# Patient Record
Sex: Male | Born: 1955 | Race: Black or African American | Hispanic: No | Marital: Married | Smoking: Former smoker
Health system: Southern US, Community
[De-identification: ages and names within clinical notes are randomized; demographics above are authoritative.]

## PROBLEM LIST (undated history)

## (undated) DIAGNOSIS — I517 Cardiomegaly: Secondary | ICD-10-CM

## (undated) DIAGNOSIS — J9611 Chronic respiratory failure with hypoxia: Secondary | ICD-10-CM

## (undated) DIAGNOSIS — R0902 Hypoxemia: Secondary | ICD-10-CM

## (undated) DIAGNOSIS — Z6841 Body Mass Index (BMI) 40.0 and over, adult: Secondary | ICD-10-CM

## (undated) DIAGNOSIS — E119 Type 2 diabetes mellitus without complications: Secondary | ICD-10-CM

## (undated) HISTORY — PX: KNEE SURGERY: SHX244

## (undated) HISTORY — DX: Type 2 diabetes mellitus without complications: E11.9

## (undated) HISTORY — DX: Body Mass Index (BMI) 40.0 and over, adult: Z684

## (undated) HISTORY — DX: Hypoxemia: R09.02

## (undated) HISTORY — DX: Chronic respiratory failure with hypoxia: J96.11

## (undated) HISTORY — DX: Cardiomegaly: I51.7

---

## 2010-02-07 HISTORY — PX: OTHER SURGICAL HISTORY: SHX169

## 2016-10-08 HISTORY — PX: TOTAL KNEE ARTHROPLASTY: SHX125

## 2018-04-06 NOTE — Progress Notes (Signed)
Cardiology Office Note   Date:  04/11/2018   ID:  [place]qu[place], Wider May 16, 1955, MRN [id number]  PCP:  Joaquin Courts, DO  Cardiologist:   Charlton Haws, MD   No chief complaint on file.     History of Present Illness: Levi Nelson is a 63 y.o. male who presents for consultation regarding cardiomegaly Referred by primary MD Ernst Breach DO and Legrand Pitts PA in [place] He also sees pulmonary there Dr Loleta Chance He has significant COPD He is concerned that he has an enlarged heart which is causing chronic hypoxemic respiratory failure Sats normally around 90% Uses oxygen 2L's Quit smoking in 2010 He has a 8 mm nodule in LLL being followed by CT most recent 12/01/17   He likely has OSA  But did not want to spend money on sleep study  He indicates being admitted to hospital in October for 24 hours being diuresed and sent home on oxygen which he has been on ever since.   I have no documentation of PFTls, R/O shunt CTA or V/Q to r/o PE. He has no idea What his diagnosis is that is causing hypoxic respiratory failure   No chest pain LE edema. No cough or sputum or fever    Past Medical History:  Diagnosis Date  . Body mass index (BMI) 40.0-44.9, adult (HCC)   . Cardiomegaly   . Chronic hypoxemic respiratory failure (HCC)   . Chronic respiratory failure with hypoxia (HCC)   . DM (diabetes mellitus) (HCC)   . Hypoxemia     Past Surgical History:  Procedure Laterality Date  . COLONSCOPY  02/07/2010  . KNEE SURGERY Left   . 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 Dulaglutide (TRULICITY) 0.75 MG/0.5ML SOPN Inject into the skin.    . furosemide (LASIX) 20 MG tablet Take 20 mg by mouth.    . metFORMIN (GLUCOPHAGE) 500 MG tablet Take 500 mg by mouth daily with breakfast.    . NONFORMULARY OR COMPOUNDED ITEM CONTOUR NEXT METER USE TWO TIMES A DAY    . OXYGEN Inhale into the lungs. 2 LITERS      No current facility-administered medications for this visit.     Allergies:   Patient has no known allergies.    Social History:  The patient  reports that he has quit smoking. His smoking use included cigarettes. He quit after 25.00 years of use. He has never used smokeless tobacco. He reports that he does not drink alcohol or use drugs.   Family History:  The patient's family history includes Arthritis in his father, mother, and sister.    ROS:  Please see the history of present illness.   Otherwise, review of systems are positive for none.   All other systems are reviewed and negative.    PHYSICAL EXAM: VS:  BP 138/78   Pulse 83   Ht 5\' 11"  (1.803 m)   Wt 135.8 kg   SpO2 91%   BMI 41.76 kg/m  , BMI Body mass index is 41.76 kg/m. Affect appropriate Obese  HEENT: normal Neck supple with no adenopathy JVP normal no bruits no thyromegaly Lungs clear with no wheezing and good diaphragmatic motion Heart:  S1/S2 no murmur, no rub, gallop or click PMI normal Abdomen: benighn, BS positve, no tenderness, no AAA no bruit.  No HSM or HJR Distal pulses intact with no bruits No edema Neuro non-focal Skin  warm and dry Right TKR     EKG:  SR RVH   Recent Labs: No results found for requested labs within last 8760 hours.    Lipid Panel No results found for: CHOL, TRIG, HDL, CHOLHDL, VLDL, LDLCALC, LDLDIRECT    Wt Readings from Last 3 Encounters:  04/11/18 135.8 kg      Other studies Reviewed: Additional studies/ records that were reviewed today include: Notes from primary and pulmonary  In [place] CT chest .    ASSESSMENT AND PLAN:  1.  Cardiomegaly likely more cor - pulmonale see below  2.  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 Will check echo for RV/LV size and function if signs of  pulmonary hypertension will refer to DM/DB for right and left cath and then to [facility] Pulmonary will order sleep study and f/u with Dr Mayford Knife.  3. DM:  Discussed low carb diet.  Target hemoglobin A1c is 6.5 or less.  Continue current medications.  4. Edema:  Will need to r/o DVT/PE at some point I think his CT in [place] was non contrast   Current medicines are reviewed at length with the patient today.  The patient does not have concerns regarding medicines.  The following changes have been made:  no change  Labs/ tests ordered today include: TTE   Orders Placed This Encounter  Procedures  . Ambulatory referral to Pulmonology  . EKG 12-Lead  . ECHOCARDIOGRAM COMPLETE  . Split night study     Disposition:   FU with cardiology pending results of TTE F/u with Dr Mayford Knife after sleep study F/U with [facility] arranged     Signed, Charlton Haws, MD  04/11/2018 5:05 PM    [facility] [address] [place], [place], [place]  [address] Phone: [phone]; Fax: [phone]

## 2018-04-10 ENCOUNTER — Encounter: Payer: Self-pay | Admitting: Cardiovascular Disease

## 2018-04-11 ENCOUNTER — Ambulatory Visit: Payer: 59 | Admitting: Cardiovascular Disease

## 2018-04-11 ENCOUNTER — Encounter

## 2018-04-11 VITALS — BP 138/78 | HR 83 | Ht 71.0 in | Wt 299.4 lb

## 2018-04-11 DIAGNOSIS — Z9981 Dependence on supplemental oxygen: Secondary | ICD-10-CM | POA: Diagnosis not present

## 2018-04-11 DIAGNOSIS — I2781 Cor pulmonale (chronic): Secondary | ICD-10-CM

## 2018-04-11 DIAGNOSIS — I517 Cardiomegaly: Secondary | ICD-10-CM | POA: Diagnosis not present

## 2018-04-11 DIAGNOSIS — J449 Chronic obstructive pulmonary disease, unspecified: Secondary | ICD-10-CM

## 2018-04-11 DIAGNOSIS — I272 Pulmonary hypertension, unspecified: Secondary | ICD-10-CM

## 2018-04-11 NOTE — Patient Instructions (Addendum)
Medication Instructions:  If you need a refill on your cardiac medications before your next appointment, please call your pharmacy.   Lab work: If you have labs (blood work) drawn today and your tests are completely normal, you will receive your results only by: Marland Kitchen MyChart Message (if you have MyChart) OR . A paper copy in the mail If you have any lab test that is abnormal or we need to change your treatment, we will call you to review the results.  Testing/Procedures: Your physician has requested that you have an echocardiogram as soon as possible. Echocardiography is a painless test that uses sound waves to create images of your heart. It provides your doctor with information about the size and shape of your heart and how well your heart's chambers and valves are working. This procedure takes approximately one hour. There are no restrictions for this procedure.  Follow-Up: At [facility], you and your health needs are our priority.  As part of our continuing mission to provide you with exceptional heart care, we have created designated Provider Care Teams.  These Care Teams include your primary Cardiologist (physician) and Advanced Practice Providers (APPs -  Physician Assistants and Nurse Practitioners) who all work together to provide you with the care you need, when you need it. You will need a follow up appointment in 6 weeks.  You may see Dr. Eden Emms or one of the following Advanced Practice Providers on your designated Care Team:   Norma Fredrickson, NP Nada Boozer, NP . Georgie Chard, NP  You have been referred to Pulmonology for oxygen dependent/COPD.  You have been referred to Dr. Mayford Knife for Sleep apnea.

## 2018-04-12 ENCOUNTER — Telehealth: Payer: Self-pay | Admitting: *Deleted

## 2018-04-12 NOTE — Telephone Encounter (Signed)
-----   Message from Ethelda Chick, RN sent at 04/12/2018  4:59 PM EST ----- [name],  Patient has been ordered a sleep study and f/u with Dr. Mayford Knife. Nothing has been scheduled yet.   Thanks, Pam

## 2018-04-13 ENCOUNTER — Telehealth: Payer: Self-pay | Admitting: *Deleted

## 2018-04-13 NOTE — Telephone Encounter (Signed)
Sleep study PA request submitted via web portal to [facility]. Clinical note faxed to [phone].

## 2018-04-13 NOTE — Telephone Encounter (Signed)
-----   Message from Reesa Chew, CMA sent at 04/12/2018  6:13 PM EST ----- Regarding: precert  ----- Message ----- From: Ethelda Chick, RN Sent: 04/12/2018   4:59 PM EST To: Reesa Chew, CMA  [place],  Patient has been ordered a sleep study and f/u with Dr. Mayford Knife. Nothing has been scheduled yet.   Thanks, Pam

## 2018-04-18 ENCOUNTER — Telehealth: Payer: Self-pay | Admitting: *Deleted

## 2018-04-18 NOTE — Telephone Encounter (Signed)
Staff message sent to [facility] approval received. Ok to schedule sleep study. Auth # [id number]. Valid dates 04/13/18 to 11/07/18.

## 2018-04-19 NOTE — Telephone Encounter (Signed)
RE: precert  Gaynelle Cage, CMA  Reesa Chew, CMA        [facility] auth received. Ok to schedule sleep study. Auth # [id number]. Valid dates 04/13/18 to 11/07/18.

## 2018-04-19 NOTE — Telephone Encounter (Signed)
Patient is scheduled for lab study on 06/10/18. Patient understands his sleep study will be done at [facility] sleep lab. Patient understands he will receive a sleep packet in a week or so. Patient understands to call if he does not receive the sleep packet in a timely manner. Patient agrees with treatment and thanked me for call.

## 2018-04-20 ENCOUNTER — Ambulatory Visit (HOSPITAL_COMMUNITY): Payer: 59 | Attending: Cardiology

## 2018-04-20 ENCOUNTER — Other Ambulatory Visit: Payer: Self-pay

## 2018-04-20 DIAGNOSIS — I272 Pulmonary hypertension, unspecified: Secondary | ICD-10-CM | POA: Insufficient documentation

## 2018-04-20 DIAGNOSIS — J449 Chronic obstructive pulmonary disease, unspecified: Secondary | ICD-10-CM | POA: Diagnosis not present

## 2018-04-20 DIAGNOSIS — Z9981 Dependence on supplemental oxygen: Secondary | ICD-10-CM | POA: Insufficient documentation

## 2018-04-20 DIAGNOSIS — I2781 Cor pulmonale (chronic): Secondary | ICD-10-CM

## 2018-04-24 ENCOUNTER — Other Ambulatory Visit: Payer: Self-pay

## 2018-04-24 ENCOUNTER — Ambulatory Visit: Payer: 59

## 2018-04-24 ENCOUNTER — Ambulatory Visit (INDEPENDENT_AMBULATORY_CARE_PROVIDER_SITE_OTHER): Payer: 59 | Admitting: Emergency Medicine

## 2018-04-24 ENCOUNTER — Encounter: Payer: Self-pay | Admitting: Emergency Medicine

## 2018-04-24 DIAGNOSIS — J9611 Chronic respiratory failure with hypoxia: Secondary | ICD-10-CM | POA: Diagnosis not present

## 2018-04-24 DIAGNOSIS — I2729 Other secondary pulmonary hypertension: Secondary | ICD-10-CM | POA: Diagnosis not present

## 2018-04-24 DIAGNOSIS — R0683 Snoring: Secondary | ICD-10-CM | POA: Insufficient documentation

## 2018-04-24 DIAGNOSIS — I270 Primary pulmonary hypertension: Secondary | ICD-10-CM | POA: Diagnosis not present

## 2018-04-24 NOTE — Assessment & Plan Note (Signed)
Suspect that this is been driven by longstanding obstructive sleep apnea.  Need to rule out other contributors including systemic hypertension (diastolic 88 today), possible thromboembolic disease, possible obstructive lung disease, possible autoimmune process.  We will start screening for all of the above with PFT, VQ scan, autoimmune labs, sleep study.

## 2018-04-24 NOTE — Assessment & Plan Note (Signed)
Sleep study currently scheduled for May, will see if we can move it up.

## 2018-04-24 NOTE — Patient Instructions (Addendum)
We will perform lab work today. We will arrange for full pulmonary function testing at your next office visit. Agree with getting your split-night sleep study.  We will see if we can get this done sooner than May 2020 We will perform a ventilation perfusion scan Please continue to wear your oxygen at 2 L/min as you have been doing. Continue your Lasix as ordered Follow with Dr Delton Coombes next available after your sleep study to discuss this and your other testing.

## 2018-04-24 NOTE — Progress Notes (Signed)
Subjective:    Patient ID: Levi Nelson, male    DOB: 17-Jun-1955, 63 y.o.   MRN: [id number]  HPI 63 year old obese man, former smoker (6-7 pack years) with diabetes, knee replacement,  possible COPD who was found to be hypoxemic on hospital admission to [facility] 11/2017.  It sounds like he probably had evidence for cor pulmonale and volume overload, was diuresed.  He was discharged on 2 L/min. He was tried empirically on Anoro after the hospitalization - no real change. He is on a stable dose lasix since the hospitalization. Never on diet meds.   He can get dyspnea with exertion, chores. No real cough.   He has never had a sleep study, is scheduled for a split-night study in May 2020  Review of his cardiology notes indicates that he had CT scan of his chest done in October 2019, had an 8 mm left lower lobe nodule.  I do not have those films available currently.  TTE 04/20/2018 >> intact LV fxn, evidence for RV strain and PAH. PASP wasn't estimated. Valves OK.    Review of Systems  Constitutional: Negative for fever and unexpected weight change.  HENT: Negative for congestion, dental problem, ear pain, nosebleeds, postnasal drip, rhinorrhea, sinus pressure, sneezing, sore throat and trouble swallowing.   Eyes: Negative for redness and itching.  Respiratory: Positive for shortness of breath. Negative for cough, chest tightness and wheezing.   Cardiovascular: Negative for palpitations and leg swelling.  Gastrointestinal: Negative for nausea and vomiting.  Genitourinary: Negative for dysuria.  Musculoskeletal: Negative for joint swelling.  Skin: Negative for rash.  Neurological: Negative for headaches.  Hematological: Does not bruise/bleed easily.  Psychiatric/Behavioral: Negative for dysphoric mood. The patient is not nervous/anxious.     Past Medical History:  Diagnosis Date  . Body mass index (BMI) 40.0-44.9, adult (HCC)   . Cardiomegaly   . Chronic hypoxemic respiratory  failure (HCC)   . Chronic respiratory failure with hypoxia (HCC)   . DM (diabetes mellitus) (HCC)   . Hypoxemia      Family History  Problem Relation Age of Onset  . Arthritis Mother   . Arthritis Father   . Arthritis Sister   No hx PAH or pulm disease.   Social History   Socioeconomic History  . Marital status: Married    Spouse name: Not on file  . Number of children: Not on file  . Years of education: Not on file  . Highest education level: Not on file  Occupational History  . Not on file  Social Needs  . Financial resource strain: Not on file  . Food insecurity:    Worry: Not on file    Inability: Not on file  . Transportation needs:    Medical: Not on file    Non-medical: Not on file  Tobacco Use  . Smoking status: Former Smoker    Years: 25.00    Types: Cigarettes  . Smokeless tobacco: Never Used  Substance and Sexual Activity  . Alcohol use: Never    Frequency: Never  . Drug use: Never  . Sexual activity: Not on file  Lifestyle  . Physical activity:    Days per week: Not on file    Minutes per session: Not on file  . Stress: Not on file  Relationships  . Social connections:    Talks on phone: Not on file    Gets together: Not on file    Attends religious service: Not on file  Active member of club or organization: Not on file    Attends meetings of clubs or organizations: Not on file    Relationship status: Not on file  . Intimate partner violence:    Fear of current or ex partner: Not on file    Emotionally abused: Not on file    Physically abused: Not on file    Forced sexual activity: Not on file  Other Topics Concern  . Not on file  Social History Narrative  . 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   [place] native. Lives in [place].   No Known Allergies   Outpatient Medications Prior to Visit  Medication Sig Dispense Refill  .  Dulaglutide (TRULICITY) 0.75 MG/0.5ML SOPN Inject into the skin.    . furosemide (LASIX) 20 MG tablet Take 20 mg by mouth daily.    . NONFORMULARY OR COMPOUNDED ITEM CONTOUR NEXT METER USE TWO TIMES A DAY    . OXYGEN Inhale into the lungs. 2 LITERS    . furosemide (LASIX) 20 MG tablet Take 20 mg by mouth.    . metFORMIN (GLUCOPHAGE) 500 MG tablet Take 500 mg by mouth daily with breakfast.     No facility-administered medications prior to visit.         Objective:   Physical Exam  Vitals:   04/24/18 1605  BP: 120/88  Pulse: 85  SpO2: 94%   Gen: Pleasant, overweight gentleman, in no distress,  normal affect  ENT: No lesions,  mouth clear,  oropharynx clear, no postnasal drip  Neck: No JVD, no stridor  Lungs: No use of accessory muscles, no crackles or wheezing on normal respiration, no wheeze on forced expiration  Cardiovascular: RRR, heart sounds normal, no murmur or gallops, only trace lower extremity peripheral edema  Musculoskeletal: No deformities, no cyanosis or clubbing  Neuro: alert, awake, non focal  Skin: Warm, no lesions or rash       Assessment & Plan:  Chronic hypoxemic respiratory failure (HCC) Suspect that this is related to right heart dysfunction and longstanding pulmonary hypertension.  His hypoxemia was discovered occultly, has probably been insidious.  Continue his supplemental oxygen at all times for now, may ultimately be able to come off if we can get his right heart function and pulmonary pressures better compensated.  Other secondary pulmonary hypertension (HCC) Suspect that this is been driven by longstanding obstructive sleep apnea.  Need to rule out other contributors including systemic hypertension (diastolic 88 today), possible thromboembolic disease, possible obstructive lung disease, possible autoimmune process.  We will start screening for all of the above with PFT, VQ scan, autoimmune labs, sleep study.  Snoring Sleep study currently  scheduled for May, will see if we can move it up.   Levy Pupa, MD, PhD 04/24/2018, 4:59 PM [facility] [phone] or if no answer [phone]

## 2018-04-24 NOTE — Assessment & Plan Note (Signed)
Suspect that this is related to right heart dysfunction and longstanding pulmonary hypertension.  His hypoxemia was discovered occultly, has probably been insidious.  Continue his supplemental oxygen at all times for now, may ultimately be able to come off if we can get his right heart function and pulmonary pressures better compensated.

## 2018-04-25 ENCOUNTER — Telehealth: Payer: Self-pay | Admitting: Emergency Medicine

## 2018-04-25 NOTE — Telephone Encounter (Signed)
Called the patient back and advised him that lab results have not been received yet. And that he will be called once they have been received and reviewed by Dr. Delton Coombes.  Patient asked about the other tests that were ordered. I told him the PFT would be done in our office. However the sleep test at [facility] normally would be scheduled by our PCC's  I checked with Almyra Free [facility]) and she confirmed it may not be scheduled for a couple of months due to coronavirus outbreak.  Advised patient of this, he voiced understanding. Nothing further needed at this time.

## 2018-04-26 LAB — ANA: ANA: POSITIVE — AB

## 2018-04-26 LAB — ANTI-NUCLEAR AB-TITER (ANA TITER): ANA Titer 1: 1:40 {titer} — ABNORMAL HIGH

## 2018-04-26 LAB — ANGIOTENSIN CONVERTING ENZYME: Angiotensin-Converting Enzyme: 25 U/L (ref 9–67)

## 2018-04-26 LAB — ANTI-SCLERODERMA ANTIBODY: Scleroderma (Scl-70) (ENA) Antibody, IgG: <1 AI

## 2018-04-26 LAB — RNP ANTIBODY: Ribonucleic Protein(ENA) Antibody, IgG: <1 AI

## 2018-04-26 LAB — RHEUMATOID FACTOR: Rheumatoid fact SerPl-aCnc: 33 IU/mL — ABNORMAL HIGH (ref <14)

## 2018-05-10 ENCOUNTER — Ambulatory Visit: Payer: Self-pay | Admitting: Interventional Cardiology

## 2018-05-18 ENCOUNTER — Telehealth: Payer: Self-pay

## 2018-05-18 NOTE — Telephone Encounter (Signed)
Virtual Visit Pre-Appointment Phone Call  Steps For Call:  1. Confirm consent - "In the setting of the current Covid19 crisis, you are scheduled for a (phone or video) visit with your provider on (date) at (time).  Just as we do with many in-office visits, in order for you to participate in this visit, we must obtain consent.  If you'd like, I can send this to your mychart (if signed up) or email for you to review.  Otherwise, I can obtain your verbal consent now.  All virtual visits are billed to your insurance company just like a normal visit would be.  By agreeing to a virtual visit, we'd like you to understand that the technology does not allow for your provider to perform an examination, and thus may limit your provider's ability to fully assess your condition.  Finally, though the technology is pretty good, we cannot assure that it will always work on either your or our end, and in the setting of a video visit, we may have to convert it to a phone-only visit.  In either situation, we cannot ensure that we have a secure connection.  Are you willing to proceed?"  2. Give patient instructions for WebEx download to smartphone as below if video visit  3. Advise patient to be prepared with any vital sign or heart rhythm information, their current medicines, and a piece of paper and pen handy for any instructions they may receive the day of their visit  4. Inform patient they will receive a phone call 15 minutes prior to their appointment time (may be from unknown caller ID) so they should be prepared to answer  5. Confirm that appointment type is correct in Epic appointment notes (video vs telephone)    TELEPHONE CALL NOTE  Levi Nelson has been deemed a candidate for a follow-up tele-health visit to limit community exposure during the Covid-19 pandemic. I spoke with the patient via phone to ensure availability of phone/video source, confirm preferred email & phone number, and discuss  instructions and expectations.  I reminded Levi Nelson to be prepared with any vital sign and/or heart rhythm information that could potentially be obtained via home monitoring, at the time of his visit. I reminded Levi Nelson to expect a phone call at the time of his visit if his visit.  Did the patient verbally acknowledge consent to treatment? Yes  Levi Copas[place]asha  Correll Nelson, CMA 05/18/2018 3:10 PM    CONSENT FOR TELE-HEALTH VISIT - PLEASE REVIEW  I hereby voluntarily request, consent and authorize [facility] and its employed or contracted physicians, physician assistants, nurse practitioners or other licensed health care professionals (the Practitioner), to provide me with telemedicine health care services (the Services") as deemed necessary by the treating Practitioner. I acknowledge and consent to receive the Services by the Practitioner via telemedicine. I understand that the telemedicine visit will involve communicating with the Practitioner through live audiovisual communication technology and the disclosure of certain medical information by electronic transmission. I acknowledge that I have been given the opportunity to request an in-person assessment or other available alternative prior to the telemedicine visit and am voluntarily participating in the telemedicine visit.  I understand that I have the right to withhold or withdraw my consent to the use of telemedicine in the course of my care at any time, without affecting my right to future care or treatment, and that the Practitioner or I may terminate the telemedicine visit at any time. I  understand that I have the right to inspect all information obtained and/or recorded in the course of the telemedicine visit and may receive copies of available information for a reasonable fee.  I understand that some of the potential risks of receiving the Services via telemedicine include:   Delay or interruption in medical evaluation due to technological  equipment failure or disruption;  Information transmitted may not be sufficient (e.g. poor resolution of images) to allow for appropriate medical decision making by the Practitioner; and/or   In rare instances, security protocols could fail, causing a breach of personal health information.  Furthermore, I acknowledge that it is my responsibility to provide information about my medical history, conditions and care that is complete and accurate to the best of my ability. I acknowledge that Practitioner's advice, recommendations, and/or decision may be based on factors not within their control, such as incomplete or inaccurate data provided by me or distortions of diagnostic images or specimens that may result from electronic transmissions. I understand that the practice of medicine is not an exact science and that Practitioner makes no warranties or guarantees regarding treatment outcomes. I acknowledge that I will receive a copy of this consent concurrently upon execution via email to the email address I last provided but may also request a printed copy by calling the office of [facility].    I understand that my insurance will be billed for this visit.   I have read or had this consent read to me.  I understand the contents of this consent, which adequately explains the benefits and risks of the Services being provided via telemedicine.   I have been provided ample opportunity to ask questions regarding this consent and the Services and have had my questions answered to my satisfaction.  I give my informed consent for the services to be provided through the use of telemedicine in my medical care  By participating in this telemedicine visit I agree to the above. 3

## 2018-05-23 NOTE — Progress Notes (Signed)
Virtual Visit via Video Note   This visit type was conducted due to national recommendations for restrictions regarding the COVID-19 Pandemic (e.g. social distancing) in an effort to limit this patient's exposure and mitigate transmission in our community.  Due to his co-morbid illnesses, this patient is at least at moderate risk for complications without adequate follow up.  This format is felt to be most appropriate for this patient at this time.  All issues noted in this document were discussed and addressed.  A limited physical exam was performed with this format.  Please refer to the patient's chart for his consent to telehealth for [facility].   Evaluation Performed:  Follow-up visit  Date:  05/25/2018   ID:  Levi Nelson, DOB Sep 27, 1955, MRN [id number]  Patient Location: Home Provider Location: Office  PCP:  Joaquin CourtsFavero, John Patrick, DO  Cardiologist:  Eden EmmsNishan Electrophysiologist:  None   Chief Complaint:  Dyspnea  History of Present Illness:    Levi MissDanny C Nelson is a 63 y.o. male first seen for cardiomegaly 04/11/18  Referred by primary MD Ernst BreachJohn Favero DO and Legrand PittsWilliam Shough PA in [place] He also sees pulmonary there Dr Loleta ChanceMichael Boyd He has significant COPD He is concerned that he has an enlarged heart which is causing chronic hypoxemic respiratory failure Sats normally around 90% Uses oxygen 2L's Quit smoking in 2010 He has a 8 mm nodule in LLL being followed by CT most recent 12/01/17   He likely has OSA  But did not want to spend money on sleep study  He indicates being admitted to hospital in October [id number] 24 hours being diuresed and sent home on oxygen which he has been on ever since.   I have no documentation of PFTls, R/O shunt CTA or V/Q to r/o PE. He has no idea What his diagnosis is that is causing hypoxic respiratory failure   No chest pain LE edema. No cough or sputum or fever   Echo 04/20/18 EF 55-60% flat septum moderate RVE no valve disease unable to estimate  PA pressure Seen by Dr Delton CoombesByrum 04/24/18 and w/u delayed due to COVID 19  Sleep study, V/Q and PFTls delayed as well as possible right heart cath  No new issues Told him I would wait to hear from Dr Delton CoombesByrum and see what his tests look like but premature to arrange right heart cath   The patient does not have symptoms concerning for COVID-19 infection (fever, chills, cough, or new shortness of breath).    Past Medical History:  Diagnosis Date  . Body mass index (BMI) 40.0-44.9, adult (HCC)   . Cardiomegaly   . Chronic hypoxemic respiratory failure (HCC)   . Chronic respiratory failure with hypoxia (HCC)   . DM (diabetes mellitus) (HCC)   . Hypoxemia    Past Surgical History:  Procedure Laterality Date  . COLONSCOPY  02/07/2010  . KNEE SURGERY Left   . TOTAL KNEE ARTHROPLASTY Right 10/08/2016     Current Meds  Medication Sig  . Dulaglutide (TRULICITY) 0.75 MG/0.5ML SOPN Inject into the skin.  . furosemide (LASIX) 20 MG tablet Take 20 mg by mouth daily.  . NONFORMULARY OR COMPOUNDED ITEM CONTOUR NEXT METER USE TWO TIMES A DAY  . OXYGEN Inhale into the lungs. 2 LITERS     Allergies:   Patient has no known allergies.   Social History   Tobacco Use  . Smoking status: Former Smoker    Years: 25.00    Types: Cigarettes  .  Smokeless tobacco: Never Used  Substance Use Topics  . Alcohol use: Never    Frequency: Never  . Drug use: Never     Family Hx: The patient's family history includes Arthritis in his father, mother, and sister.  ROS:   Please see the history of present illness.     All other systems reviewed and are negative.   Prior CV studies:   The following studies were reviewed today:  Echo 04/20/18  Labs/Other Tests and Data Reviewed:    EKG: SR RAD RVH ICRBBB   Recent Labs: No results found for requested labs within last 8760 hours.   Recent Lipid Panel No results found for: CHOL, TRIG, HDL, CHOLHDL, LDLCALC, LDLDIRECT  Wt Readings from Last 3  Encounters:  05/25/18 128.8 kg  04/11/18 135.8 kg     Objective:    Vital Signs:  Ht 5\' 11"  (1.803 m)   Wt 128.8 kg   BMI 39.61 kg/m    Obese male Skin warm and dry JVP elevated Mild tachypnea on oxygen Trace edema   ASSESSMENT & PLAN:    1.  Cardiomegaly likely more cor - pulmonale see below  2.  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 Studies delayed due to COVID 19 f/u Dr Delton Coombes Suspect will need right heart cath when COVID19 restrictions lifted 3. DM:  Discussed low carb diet.  Target hemoglobin A1c is 6.5 or less.  Continue current medications.  4. Edema: continue lasix   COVID-19 Education: The signs and symptoms of COVID-19 were discussed with the patient and how to seek care for testing (follow up with PCP or arrange E-visit).  The importance of social distancing was discussed today.  Time:   Today, I have spent 30 minutes with the patient with telehealth technology discussing the above problems.     Medication Adjustments/Labs and Tests Ordered: Current medicines are reviewed at length with the patient today.  Concerns regarding medicines are outlined above.   Tests Ordered: No orders of the defined types were placed in this encounter.   Medication Changes: No orders of the defined types were placed in this encounter.   Disposition:  Follow up in 3 months  Signed, Charlton Haws, MD  05/25/2018 11:29 AM    [facility]

## 2018-05-25 ENCOUNTER — Other Ambulatory Visit: Payer: Self-pay

## 2018-05-25 ENCOUNTER — Telehealth (INDEPENDENT_AMBULATORY_CARE_PROVIDER_SITE_OTHER): Payer: 59 | Admitting: Cardiovascular Disease

## 2018-05-25 ENCOUNTER — Encounter: Payer: Self-pay | Admitting: Cardiovascular Disease

## 2018-05-25 DIAGNOSIS — I2781 Cor pulmonale (chronic): Secondary | ICD-10-CM | POA: Diagnosis not present

## 2018-05-25 DIAGNOSIS — I517 Cardiomegaly: Secondary | ICD-10-CM

## 2018-05-25 NOTE — Patient Instructions (Addendum)
Medication Instructions:   If you need a refill on your cardiac medications before your next appointment, please call your pharmacy.   Lab work:  If you have labs (blood work) drawn today and your tests are completely normal, you will receive your results only by: Marland Kitchen MyChart Message (if you have MyChart) OR . A paper copy in the mail If you have any lab test that is abnormal or we need to change your treatment, we will call you to review the results.  Testing/Procedures: None ordered today. Follow-Up: At [facility], you and your health needs are our priority.  As part of our continuing mission to provide you with exceptional heart care, we have created designated Provider Care Teams.  These Care Teams include your primary Cardiologist (physician) and Advanced Practice Providers (APPs -  Physician Assistants and Nurse Practitioners) who all work together to provide you with the care you need, when you need it. You will need a follow up appointment in 4 months.  Please call our office 2 months in advance to schedule this appointment.  You may see Dr. Eden Emms or one of the following Advanced Practice Providers on your designated Care Team:   Norma Fredrickson, NP Nada Boozer, NP . Georgie Chard, NP

## 2018-06-10 ENCOUNTER — Encounter (HOSPITAL_BASED_OUTPATIENT_CLINIC_OR_DEPARTMENT_OTHER): Payer: 59

## 2018-07-10 ENCOUNTER — Other Ambulatory Visit: Payer: Self-pay | Admitting: Emergency Medicine

## 2018-07-10 ENCOUNTER — Other Ambulatory Visit: Payer: Self-pay

## 2018-07-10 ENCOUNTER — Ambulatory Visit (HOSPITAL_COMMUNITY)
Admission: RE | Admit: 2018-07-10 | Discharge: 2018-07-10 | Disposition: A | Discharge status: Home / Self Care | Payer: 59 | Source: Ambulatory Visit | Attending: Emergency Medicine | Admitting: Emergency Medicine

## 2018-07-10 ENCOUNTER — Encounter (HOSPITAL_COMMUNITY)
Admission: RE | Admit: 2018-07-10 | Discharge: 2018-07-10 | Disposition: A | Discharge status: Home / Self Care | Payer: 59 | Source: Ambulatory Visit | Attending: Emergency Medicine | Admitting: Emergency Medicine

## 2018-07-10 DIAGNOSIS — I270 Primary pulmonary hypertension: Secondary | ICD-10-CM

## 2018-07-10 MED ORDER — TECHNETIUM TO 99M ALBUMIN AGGREGATED
1.5000 | Freq: Once | INTRAVENOUS | Status: AC | PRN
  Administered 2018-07-10: 1.6 via INTRAVENOUS

## 2018-07-17 ENCOUNTER — Other Ambulatory Visit (HOSPITAL_COMMUNITY)
Admission: RE | Admit: 2018-07-17 | Discharge: 2018-07-17 | Disposition: A | Discharge status: Home / Self Care | Payer: 59 | Source: Ambulatory Visit | Attending: Cardiology | Admitting: Cardiology

## 2018-07-17 ENCOUNTER — Other Ambulatory Visit: Payer: Self-pay

## 2018-07-17 DIAGNOSIS — Z1159 Encounter for screening for other viral diseases: Secondary | ICD-10-CM | POA: Insufficient documentation

## 2018-07-17 DIAGNOSIS — Z01812 Encounter for preprocedural laboratory examination: Secondary | ICD-10-CM | POA: Insufficient documentation

## 2018-07-18 LAB — NOVEL CORONAVIRUS, NAA (HOSP ORDER, SEND-OUT TO REF LAB; TAT 18-24 HRS): SARS-CoV-2, NAA: NOT DETECTED

## 2018-07-19 ENCOUNTER — Encounter (HOSPITAL_BASED_OUTPATIENT_CLINIC_OR_DEPARTMENT_OTHER): Payer: 59

## 2018-07-20 ENCOUNTER — Ambulatory Visit (HOSPITAL_BASED_OUTPATIENT_CLINIC_OR_DEPARTMENT_OTHER): Payer: 59 | Attending: Cardiovascular Disease | Admitting: Cardiology

## 2018-07-20 ENCOUNTER — Other Ambulatory Visit: Payer: Self-pay

## 2018-07-20 DIAGNOSIS — G478 Other sleep disorders: Secondary | ICD-10-CM | POA: Insufficient documentation

## 2018-07-20 DIAGNOSIS — J449 Chronic obstructive pulmonary disease, unspecified: Secondary | ICD-10-CM | POA: Insufficient documentation

## 2018-07-20 DIAGNOSIS — I272 Pulmonary hypertension, unspecified: Secondary | ICD-10-CM | POA: Diagnosis not present

## 2018-07-20 DIAGNOSIS — Z9981 Dependence on supplemental oxygen: Secondary | ICD-10-CM | POA: Diagnosis not present

## 2018-07-20 DIAGNOSIS — J441 Chronic obstructive pulmonary disease with (acute) exacerbation: Secondary | ICD-10-CM

## 2018-07-20 DIAGNOSIS — I2781 Cor pulmonale (chronic): Secondary | ICD-10-CM | POA: Diagnosis not present

## 2018-07-20 DIAGNOSIS — R0902 Hypoxemia: Secondary | ICD-10-CM | POA: Insufficient documentation

## 2018-07-20 DIAGNOSIS — G4733 Obstructive sleep apnea (adult) (pediatric): Secondary | ICD-10-CM | POA: Insufficient documentation

## 2018-07-21 DIAGNOSIS — J441 Chronic obstructive pulmonary disease with (acute) exacerbation: Secondary | ICD-10-CM | POA: Insufficient documentation

## 2018-07-23 ENCOUNTER — Other Ambulatory Visit: Payer: Self-pay

## 2018-07-24 NOTE — Procedures (Signed)
   Patient Name: Desmon, Hitchner Date:01/24/2017 07/20/2018 Gender: Male D.O.B: [birth year] Age (years): 63 Referring Provider: Jenkins Rouge Height (inches): 38 Interpreting Physician: Fransico Him MD, ABSM Weight (lbs): 280 RPSGT: Lanae Boast BMI: 39 MRN: [id number] Neck Size: 17.50  CLINICAL INFORMATION Sleep Study Type: NPSG  Indication for sleep study: COPD, Hypertension  Epworth Sleepiness Score: 1  SLEEP STUDY TECHNIQUE As per the AASM Manual for the Scoring of Sleep and Associated Events v2.3 (April 2016) with a hypopnea requiring 4% desaturations.  The channels recorded and monitored were frontal, central and occipital EEG, electrooculogram (EOG), submentalis EMG (chin), nasal and oral airflow, thoracic and abdominal wall motion, anterior tibialis EMG, snore microphone, electrocardiogram, and pulse oximetry.  MEDICATIONS Medications self-administered by patient taken the night of the study : N/A  SLEEP ARCHITECTURE The study was initiated at 10:41:08 PM and ended at 4:56:55 AM.  Sleep onset time was 19.6 minutes and the sleep efficiency was 83.6%. The total sleep time was 314.2 minutes.  Stage REM latency was 90.5 minutes.  The patient spent 22.44% of the night in stage N1 sleep, 62.60% in stage N2 sleep, 0.00% in stage N3 and 15% in REM.  Alpha intrusion was absent.  Supine sleep was 27.25%.  RESPIRATORY PARAMETERS The overall apnea/hypopnea index (AHI) was 4.0 per hour. There were 21 total apneas, including 19 obstructive, 1 central and 1 mixed apneas. There were 0 hypopneas and 108 RERAs.  The AHI during Stage REM sleep was 16.6 per hour.  AHI while supine was 3.5 per hour.  The mean oxygen saturation was 90.33%. The minimum SpO2 during sleep was 86.00%.  soft snoring was noted during this study.  CARDIAC DATA The 2 lead EKG demonstrated sinus rhythm. The mean heart rate was 70.27 beats per minute. Other EKG findings include: None.  LEG  MOVEMENT DATA The total PLMS were 18 with a resulting PLMS index of 3.44. Associated arousal with leg movement index was 0.4 .  IMPRESSIONS - No significant obstructive sleep apnea overall occurred during this study (AHI = 4.0/h) but he has evidence of upper airway resistance syndrome with an elevated RDI of 24/hr.  There was moderate OSA during REM sleep (AHI 16.6/hr)  with significant hypoxemia.   - No significant central sleep apnea occurred during this study (CAI = 0.2/h). - Mild oxygen desaturation was noted during this study (Min O2 = 86.00%). - The patient snored with soft snoring volume. - No cardiac abnormalities were noted during this study. - Clinically significant periodic limb movements did not occur during sleep. No significant associated arousals.  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 - Avoid alcohol, sedatives and other CNS depressants that may worsen sleep apnea and disrupt normal sleep architecture. - Sleep hygiene should be reviewed to assess factors that may improve sleep quality. - Weight management and regular exercise should be initiated or continued if appropriate. - Followup in sleep lab in 10 weeks.   [Electronically signed] 07/24/2018 07:11 PM  Fransico Him MD, ABSM Diplomate, American Board of Sleep Medicine

## 2018-07-25 ENCOUNTER — Telehealth: Payer: Self-pay | Admitting: *Deleted

## 2018-07-25 DIAGNOSIS — G4733 Obstructive sleep apnea (adult) (pediatric): Secondary | ICD-10-CM

## 2018-07-25 DIAGNOSIS — R0902 Hypoxemia: Secondary | ICD-10-CM

## 2018-07-25 NOTE — Telephone Encounter (Signed)
-----   Message from Sueanne Margarita, MD sent at 07/24/2018  7:17 PM EDT ----- Please let patient know that they have sleep apnea during dream state of sleep but overall has upper airway resistance syndrome.  O2 drops with events.  Recommend starting on auto CPAP from 4 to 20cm H2O.  Please get an overnight pulse ox on auto CPAP.  Followup with me 10 weeks after getting device

## 2018-07-25 NOTE — Telephone Encounter (Addendum)
Informed patient of sleep study results and patient understanding was verbalized. Patient understands his sleep study showed they have sleep apnea during dream state of sleep but overall has upper airway resistance syndrome. O2 drops with events. Recommend starting on auto CPAP from 4 to 20cm H2O. Please get an overnight pulse ox on auto CPAP.  Upon patient request DME selection is [facility] [place]. Patient understands he will be contacted by [facility] to set up his cpap. Patient understands to call if [facility] does not contact him with new setup in a timely manner. Patient understands they will be called once confirmation has been received from [facility] that they have received their new machine to schedule 10 week follow up appointment. [facility] notified of new cpap order  Please add to airview Patient was grateful for the call and thanked me. Followup with me 10 weeks after getting device

## 2018-07-31 ENCOUNTER — Telehealth: Payer: Self-pay

## 2018-07-31 NOTE — Telephone Encounter (Signed)
Scarlette Calico, RN  Michaelyn Barter, RN        Ok, here is some dates for this cath:  [place] 7/2 AM  Bens[place] 7/6  [place]ens[place] 7/9 or 7/10    Left message for patient to call back to see about scheduling heart cath.

## 2018-08-01 NOTE — Telephone Encounter (Signed)
Called patient. Patient would like to have done on 08/17/18 with Dr. Haroldine Laws. Will send to [facility] his nurse to help schedule. Informed patient once we have all the details, that we would mail a letter with instructions to him.

## 2018-08-08 ENCOUNTER — Encounter (HOSPITAL_COMMUNITY): Payer: Self-pay | Admitting: *Deleted

## 2018-08-08 NOTE — Telephone Encounter (Signed)
[facility] sch for 7/10 at 12 pm w/Dr Bensimhon.  Attempted to call pt to review instructions (letter in chart) and sch Covid testing and Left message to call back

## 2018-08-13 ENCOUNTER — Other Ambulatory Visit (HOSPITAL_COMMUNITY): Payer: Self-pay | Admitting: *Deleted

## 2018-08-13 DIAGNOSIS — I272 Pulmonary hypertension, unspecified: Secondary | ICD-10-CM

## 2018-08-13 MED ORDER — SODIUM CHLORIDE 0.9% FLUSH: 3.0000 mL | Freq: Two times a day (BID) | INTRAVENOUS | Status: DC

## 2018-08-13 NOTE — Telephone Encounter (Signed)
Spoke w/pt, he is aware of cath and all instructions reviewed with him, he will go to [place] [place] for [employer]

## 2018-08-14 ENCOUNTER — Other Ambulatory Visit (HOSPITAL_COMMUNITY)
Admission: RE | Admit: 2018-08-14 | Discharge: 2018-08-14 | Disposition: A | Discharge status: Home / Self Care | Payer: 59 | Source: Ambulatory Visit | Attending: Internal Medicine | Admitting: Internal Medicine

## 2018-08-14 ENCOUNTER — Other Ambulatory Visit: Payer: Self-pay

## 2018-08-14 DIAGNOSIS — Z1159 Encounter for screening for other viral diseases: Secondary | ICD-10-CM | POA: Insufficient documentation

## 2018-08-14 DIAGNOSIS — Z01812 Encounter for preprocedural laboratory examination: Secondary | ICD-10-CM | POA: Insufficient documentation

## 2018-08-14 LAB — SARS CORONAVIRUS 2 (TAT 6-24 HRS)

## 2018-08-15 ENCOUNTER — Telehealth: Payer: Self-pay | Admitting: Emergency Medicine

## 2018-08-15 NOTE — Progress Notes (Signed)
Spoke with Levi Nelson this morning to arrange for him to come in at 9 am DOS for Rapid Covid 19 test due to inconclusive results from Covid 19 test on 08/14/18.

## 2018-08-15 NOTE — Telephone Encounter (Signed)
Checked pt's chart and order review tab.   On 6/12, pt had a split night study performed and the results are in pt's chart.  On 6/16, pt's cardiologist Dr. Fransico Him placed order for pt to have Resmed CPAP with 2 week autotitration from 4 to 20cm. Then on 6/17, there was another order that was placed by pt's cardiologist Dr. Fransico Him for recommendation for pt to start on auto CPAP from 4 to 20cm. These orders were placed as the for home use only DME CPAP which looks like they were not sent to an actual DME company.  Called and spoke with pt who stated he called sleep center and spoke with Gae Bon and was told by her if there were any problems for pt to call her.  Pt called Gae Bon back and was told by Gae Bon that pt's sleep study was sent to [facility] in [place] and they stated to her that they were missing some information. They stated they were missing a graph and this had been refaxed to [place] by Gae Bon.  Per pt, [facility] now has all info but they told pt that it could take another 4-5 days to finish processing before they will be able to give pt the equipment he needs. Stated to pt that there is a process that all DMEs have to go through before they can be able to send out equipment and pt verbalized understanding. Nothing further needed.

## 2018-08-17 ENCOUNTER — Ambulatory Visit (HOSPITAL_COMMUNITY)
Admission: RE | Admit: 2018-08-17 | Discharge: 2018-08-17 | Disposition: A | Discharge status: Home / Self Care | Payer: 59 | Attending: Internal Medicine | Admitting: Internal Medicine

## 2018-08-17 ENCOUNTER — Other Ambulatory Visit (HOSPITAL_COMMUNITY): Payer: Self-pay | Admitting: *Deleted

## 2018-08-17 ENCOUNTER — Other Ambulatory Visit: Payer: Self-pay

## 2018-08-17 ENCOUNTER — Encounter (HOSPITAL_COMMUNITY): Payer: Self-pay | Admitting: *Deleted

## 2018-08-17 ENCOUNTER — Other Ambulatory Visit (HOSPITAL_COMMUNITY)
Admission: RE | Admit: 2018-08-17 | Discharge: 2018-08-17 | Disposition: A | Discharge status: Home / Self Care | Payer: 59 | Source: Ambulatory Visit | Attending: Internal Medicine | Admitting: Internal Medicine

## 2018-08-17 ENCOUNTER — Encounter (HOSPITAL_COMMUNITY)
Admission: RE | Disposition: A | Discharge status: Home / Self Care | Payer: Self-pay | Source: Home / Self Care | Attending: Internal Medicine

## 2018-08-17 DIAGNOSIS — Z1159 Encounter for screening for other viral diseases: Secondary | ICD-10-CM | POA: Insufficient documentation

## 2018-08-17 DIAGNOSIS — Z6841 Body Mass Index (BMI) 40.0 and over, adult: Secondary | ICD-10-CM | POA: Diagnosis not present

## 2018-08-17 DIAGNOSIS — E669 Obesity, unspecified: Secondary | ICD-10-CM | POA: Diagnosis not present

## 2018-08-17 DIAGNOSIS — Z79899 Other long term (current) drug therapy: Secondary | ICD-10-CM | POA: Insufficient documentation

## 2018-08-17 DIAGNOSIS — Z87891 Personal history of nicotine dependence: Secondary | ICD-10-CM | POA: Diagnosis not present

## 2018-08-17 DIAGNOSIS — I2721 Secondary pulmonary arterial hypertension: Secondary | ICD-10-CM | POA: Diagnosis present

## 2018-08-17 DIAGNOSIS — J9611 Chronic respiratory failure with hypoxia: Secondary | ICD-10-CM | POA: Insufficient documentation

## 2018-08-17 DIAGNOSIS — J449 Chronic obstructive pulmonary disease, unspecified: Secondary | ICD-10-CM | POA: Diagnosis not present

## 2018-08-17 DIAGNOSIS — E119 Type 2 diabetes mellitus without complications: Secondary | ICD-10-CM | POA: Diagnosis not present

## 2018-08-17 DIAGNOSIS — I272 Pulmonary hypertension, unspecified: Secondary | ICD-10-CM

## 2018-08-17 HISTORY — PX: RIGHT/LEFT HEART CATH AND CORONARY ANGIOGRAPHY: CATH118266

## 2018-08-17 LAB — BASIC METABOLIC PANEL
Anion gap: 10 (ref 5–15)
BUN: 11 mg/dL (ref 8–23)
CO2: 23 mmol/L (ref 22–32)
Calcium: 9.2 mg/dL (ref 8.9–10.3)
Chloride: 105 mmol/L (ref 98–111)
Creatinine, Ser: 0.97 mg/dL (ref 0.61–1.24)
GFR calc Af Amer: >60 mL/min (ref >60)
GFR calc non Af Amer: >60 mL/min (ref >60)
Glucose, Bld: 99 mg/dL (ref 70–99)
Potassium: 4.3 mmol/L (ref 3.5–5.1)
Sodium: 138 mmol/L (ref 135–145)

## 2018-08-17 LAB — CBC
HCT: 50.2 % (ref 39.0–52.0)
Hemoglobin: 15.9 g/dL (ref 13.0–17.0)
MCH: 25.6 pg — ABNORMAL LOW (ref 26.0–34.0)
MCHC: 31.7 g/dL (ref 30.0–36.0)
MCV: 80.7 fL (ref 80.0–100.0)
Platelets: 207 10*3/uL (ref 150–400)
RBC: 6.22 MIL/uL — ABNORMAL HIGH (ref 4.22–5.81)
RDW: 14.5 % (ref 11.5–15.5)
WBC: 7.6 10*3/uL (ref 4.0–10.5)
nRBC: 0 % (ref 0.0–0.2)

## 2018-08-17 LAB — GLUCOSE, CAPILLARY
Glucose-Capillary: 98 mg/dL (ref 70–99)
Glucose-Capillary: 102 mg/dL — ABNORMAL HIGH (ref 70–99)

## 2018-08-17 LAB — SARS CORONAVIRUS 2 BY RT PCR (HOSPITAL ORDER, PERFORMED IN ~~LOC~~ HOSPITAL LAB): SARS Coronavirus 2: NEGATIVE

## 2018-08-17 SURGERY — RIGHT/LEFT HEART CATH AND CORONARY ANGIOGRAPHY
Anesthesia: LOCAL

## 2018-08-17 MED ORDER — VERAPAMIL HCL 2.5 MG/ML IV SOLN
INTRAVENOUS | Status: DC | PRN
  Administered 2018-08-17: 10 mL via INTRA_ARTERIAL

## 2018-08-17 MED ORDER — HEPARIN (PORCINE) IN NACL 1000-0.9 UT/500ML-% IV SOLN
INTRAVENOUS | Status: AC
  Filled 2018-08-17: qty 500

## 2018-08-17 MED ORDER — FENTANYL CITRATE (PF) 100 MCG/2ML IJ SOLN
INTRAMUSCULAR | Status: AC
  Filled 2018-08-17: qty 2

## 2018-08-17 MED ORDER — MIDAZOLAM HCL 2 MG/2ML IJ SOLN
INTRAMUSCULAR | Status: AC
  Filled 2018-08-17: qty 2

## 2018-08-17 MED ORDER — LIDOCAINE HCL (PF) 1 % IJ SOLN
INTRAMUSCULAR | Status: AC
  Filled 2018-08-17: qty 30

## 2018-08-17 MED ORDER — HEPARIN SODIUM (PORCINE) 1000 UNIT/ML IJ SOLN
INTRAMUSCULAR | Status: AC
  Filled 2018-08-17: qty 1

## 2018-08-17 MED ORDER — HEPARIN SODIUM (PORCINE) 1000 UNIT/ML IJ SOLN
INTRAMUSCULAR | Status: DC | PRN
  Administered 2018-08-17: 5000 [IU] via INTRAVENOUS

## 2018-08-17 MED ORDER — IOHEXOL 350 MG/ML SOLN
INTRAVENOUS | Status: DC | PRN
  Administered 2018-08-17: 70 mL via INTRA_ARTERIAL

## 2018-08-17 MED ORDER — SODIUM CHLORIDE 0.9% FLUSH: 3.0000 mL | INTRAVENOUS | Status: DC | PRN

## 2018-08-17 MED ORDER — HEPARIN (PORCINE) IN NACL 1000-0.9 UT/500ML-% IV SOLN
INTRAVENOUS | Status: DC | PRN
  Administered 2018-08-17 (×2): 500 mL

## 2018-08-17 MED ORDER — FENTANYL CITRATE (PF) 100 MCG/2ML IJ SOLN
INTRAMUSCULAR | Status: DC | PRN
  Administered 2018-08-17: 25 ug via INTRAVENOUS

## 2018-08-17 MED ORDER — LIDOCAINE HCL (PF) 1 % IJ SOLN
INTRAMUSCULAR | Status: DC | PRN
  Administered 2018-08-17 (×2): 2 mL

## 2018-08-17 MED ORDER — MIDAZOLAM HCL 2 MG/2ML IJ SOLN
INTRAMUSCULAR | Status: DC | PRN
  Administered 2018-08-17: 1 mg via INTRAVENOUS

## 2018-08-17 MED ORDER — SODIUM CHLORIDE 0.9 % IV SOLN: 250.0000 mL | INTRAVENOUS | Status: DC | PRN

## 2018-08-17 MED ORDER — VERAPAMIL HCL 2.5 MG/ML IV SOLN
INTRAVENOUS | Status: AC
  Filled 2018-08-17: qty 2

## 2018-08-17 MED ORDER — ASPIRIN 81 MG PO CHEW
81.0000 mg | CHEWABLE_TABLET | ORAL | Status: AC
  Administered 2018-08-17: 81 mg via ORAL
  Filled 2018-08-17: qty 1

## 2018-08-17 MED ORDER — SODIUM CHLORIDE 0.9 % IV SOLN
INTRAVENOUS | Status: DC
  Administered 2018-08-17: 10:00:00 via INTRAVENOUS

## 2018-08-17 SURGICAL SUPPLY — 14 items
BAG SNAP BAND KOVER 36X36 (MISCELLANEOUS) ×2 IMPLANT
CATH 5FR JL3.5 JR4 ANG PIG MP (CATHETERS) ×2 IMPLANT
CATH BALLN WEDGE 5F 110CM (CATHETERS) ×2 IMPLANT
COVER DOME SNAP 22 D (MISCELLANEOUS) ×2 IMPLANT
DEVICE RAD COMP TR BAND LRG (VASCULAR PRODUCTS) ×2 IMPLANT
GLIDESHEATH SLEND SS 6F .021 (SHEATH) ×2 IMPLANT
GUIDEWIRE INQWIRE 1.5J.035X260 (WIRE) ×1 IMPLANT
INQWIRE 1.5J .035X260CM (WIRE) ×2
KIT HEART LEFT (KITS) ×2 IMPLANT
KIT MICROPUNCTURE NIT STIFF (SHEATH) ×2 IMPLANT
PACK CARDIAC CATHETERIZATION (CUSTOM PROCEDURE TRAY) ×2 IMPLANT
SHEATH GLIDE SLENDER 4/5FR (SHEATH) ×2 IMPLANT
SHEATH PROBE COVER 6X72 (BAG) ×2 IMPLANT
TRANSDUCER W/STOPCOCK (MISCELLANEOUS) ×2 IMPLANT

## 2018-08-17 NOTE — Interval H&P Note (Signed)
History and Physical Interval Note:  08/17/2018 9:21 AM  Levi Nelson  has presented today for surgery, with the diagnosis of pulmonary hypertention.  The various methods of treatment have been discussed with the patient and family. After consideration of risks, benefits and other options for treatment, the patient has consented to  Procedure(s): RIGHT/LEFT HEART CATH AND CORONARY ANGIOGRAPHY (N/A) and possible coronary angioplasty as a surgical intervention.  The patient's history has been reviewed, patient examined, no change in status, stable for surgery.  I have reviewed the patient's chart and labs.  Questions were answered to the patient's satisfaction.     Daniel Bensimhon

## 2018-08-17 NOTE — Research (Signed)
[place] Informed Consent   Subject Name: Levi Nelson  Subject met inclusion and exclusion criteria.  The informed consent form, study requirements and expectations were reviewed with the subject and questions and concerns were addressed prior to the signing of the consent form.  The subject verbalized understanding of the trail requirements.  The subject agreed to participate in the [facility] trial and signed the informed consent.  The informed consent was obtained prior to performance of any protocol-specific procedures for the subject.  A copy of the signed informed consent was given to the subject and a copy was placed in the subject's medical record.  [name] 08/17/2018, 10:25 AM

## 2018-08-17 NOTE — H&P (Signed)
Advanced Heart Failure Team History and Physical Note   PCP:  [name], DO  PCP-Cardiology: No primary care provider on file.     Reason for Admission:  R/L heart cath   HPI:    Levi Nelson is a 63 y.o. male with DM2, COPD, morbid obesity referred by Dr. Johnsie Cancel for R & L heart cath.  He was referred to Dr. Johnsie Cancel earlier this year by primary MD [name] DO and [name] in [place] He also sees pulmonary there Dr [name]. He has significant COPD and chronic hypoxemic respiratory failure Sats normally around 90% Uses oxygen 2L's Quit smoking in 2010 He has a 8 mm nodule in LLL being followed by CT most recent 12/01/17   He likely has OSA and OHS as well  He had + sleeps study in 6/20/  Echo 3/20 with LVEF 55-60% with moderate RV dilation and normal function. No significant TR so RVSP could not be assessed.   Gets SOB with mild activity. Occasional edema. No CP.   Review of Systems: [y] = yes, [ ]  = no   General: Weight gain Blue.Reese ]; Weight loss [ ] ; Anorexia [ ] ; Fatigue [ ] ; Fever [ ] ; Chills [ ] ; Weakness [ ]   Cardiac: Chest pain/pressure [ ] ; Resting SOB [ ] ; Exertional SOB Blue.Reese ]; Orthopnea [ ] ; Pedal Edema Blue.Reese ]; Palpitations [ ] ; Syncope [ ] ; Presyncope [ ] ; Paroxysmal nocturnal dyspnea[ ]   Pulmonary: Cough [ ] ; Wheezing[ ] ; Hemoptysis[ ] ; Sputum [ ] ; Snoring [ ]   GI: Vomiting[ ] ; Dysphagia[ ] ; Melena[ ] ; Hematochezia [ ] ; Heartburn[ ] ; Abdominal pain [ ] ; Constipation [ ] ; Diarrhea [ ] ; BRBPR [ ]   GU: Hematuria[ ] ; Dysuria [ ] ; Nocturia[ ]   Vascular: Pain in legs with walking [ ] ; Pain in feet with lying flat [ ] ; Non-healing sores [ ] ; Stroke [ ] ; TIA [ ] ; Slurred speech [ ] ;  Neuro: Headaches[ ] ; Vertigo[ ] ; Seizures[ ] ; Paresthesias[ ] ;Blurred vision [ ] ; Diplopia [ ] ; Vision changes [ ]   Ortho/Skin: Arthritis [ y; Joint pain Blue.Reese ]; Muscle pain [ ] ; Joint swelling [ ] ; Back Pain [ ] ; Rash [ ]   Psych: Depression[ ] ; Anxiety[ ]   Heme: Bleeding  problems [ ] ; Clotting disorders [ ] ; Anemia [ ]   Endocrine: Diabetes Blue.Reese ]; Thyroid dysfunction[ ]    Home Medications Prior to Admission medications   Medication Sig Start Date End Date Taking? Authorizing Provider  Dulaglutide (TRULICITY) 7.12 WP/8.0DX SOPN Inject 0.75 mg into the skin every Wednesday.    Yes [provider]  furosemide (LASIX) 20 MG tablet Take 20 mg by mouth daily.   Yes [provider]  OXYGEN Inhale 2 L into the lungs continuous.    Yes [provider]    Past Medical History: Past Medical History:  Diagnosis Date  . Body mass index (BMI) 40.0-44.9, adult ([name])   . Cardiomegaly   . Chronic hypoxemic respiratory failure ([name])   . Chronic respiratory failure with hypoxia ([name])   . DM (diabetes mellitus) ([place])   . Hypoxemia     Past Surgical History: Past Surgical History:  Procedure Laterality Date  . COLONSCOPY  02/07/2010  . KNEE SURGERY Left   . TOTAL KNEE ARTHROPLASTY Right 10/08/2016    Family History:  Family History  Problem Relation Age of Onset  . Arthritis Mother   . Arthritis Father   . Arthritis Sister     Social History: Social  History   Socioeconomic History  . Marital status: Married    Spouse name: Not on file  . Number of children: Not on file  . Years of education: Not on file  . Highest education level: Not on file  Occupational History  . Not on file  Social Needs  . Financial resource strain: Not on file  . Food insecurity    Worry: Not on file    Inability: Not on file  . Transportation needs    Medical: Not on file    Non-medical: Not on file  Tobacco Use  . Smoking status: Former Smoker    Years: 25.00    Types: Cigarettes  . Smokeless tobacco: Never Used  Substance and Sexual Activity  . Alcohol use: Never    Frequency: Never  . Drug use: Never  . Sexual activity: Not on file  Lifestyle  . Physical activity    Days per week: Not on file    Minutes per session: Not on file  .  Stress: Not on file  Relationships  . Social Musicianconnections    Talks on phone: Not on file    Gets together: Not on file    Attends religious service: Not on file    Active member of club or organization: Not on file    Attends meetings of clubs or organizations: Not on file    Relationship status: Not on file  Other Topics Concern  . Not on file  Social History Narrative  . Not on file    Allergies:  No Known Allergies   Physical Exam     General:  Obese male on home O2 No respiratory difficulty HEENT: Normal Neck: Supple. no JVD. Carotids 2+ bilat; no bruits. No lymphadenopathy or thyromegaly appreciated. Cor: PMI nondisplaced. Regular rate & rhythm. No rubs, gallops or murmurs. Lungs: Clear with decrease BS throguhout Abdomen: Obese Soft, nontender, nondistended. No hepatosplenomegaly. No bruits or masses. Good bowel sounds. Extremities: No cyanosis, clubbing, rash, edema Neuro: Alert & oriented x 3, cranial nerves grossly intact. moves all 4 extremities w/o difficulty. Affect pleasant.  Labs     Basic Metabolic Panel: No results for input(s): NA, K, CL, CO2, GLUCOSE, BUN, CREATININE, CALCIUM, MG, PHOS in the last 168 hours.  Liver Function Tests: No results for input(s): AST, ALT, ALKPHOS, BILITOT, PROT, ALBUMIN in the last 168 hours. No results for input(s): LIPASE, AMYLASE in the last 168 hours. No results for input(s): AMMONIA in the last 168 hours.  CBC: No results for input(s): WBC, NEUTROABS, HGB, HCT, MCV, PLT in the last 168 hours.  Cardiac Enzymes: No results for input(s): CKTOTAL, CKMB, CKMBINDEX, TROPONINI in the last 168 hours.  BNP: BNP (last 3 results) No results for input(s): BNP in the last 8760 hours.  ProBNP (last 3 results) No results for input(s): PROBNP in the last 8760 hours.   CBG: No results for input(s): GLUCAP in the last 168 hours.  Coagulation Studies: No results for input(s): LABPROT, INR in the last 72 hours.  Imaging:  No  results found.   Assessment/Plan   1. Dyspnea with chronic hypoxic respiratory failure with RV dilation  - likely due to COPD/OSA/OHS but may have component of diastolic HF and coronary ischemia as well - will plan R/L cath today with coronary angio to further assess  2. Obesity - stressed need for weight loss  3. DM2  - per primary. Consider SGLT2i   Arvilla Meresaniel Haeden Hudock, MD 08/17/2018, 9:14 AM  Advanced Heart  Failure Team Pager [phone] (M-F; 7a - 4p)  Please contact [facility] for night-coverage after hours (4p -7a ) and weekends on [email or web address]

## 2018-08-17 NOTE — Discharge Instructions (Signed)
Radial Site Care ° °This sheet gives you information about how to care for yourself after your procedure. Your health care provider may also give you more specific instructions. If you have problems or questions, contact your health care provider. °What can I expect after the procedure? °After the procedure, it is common to have: °· Bruising and tenderness at the catheter insertion area. °Follow these instructions at home: °Medicines °· Take over-the-counter and prescription medicines only as told by your health care provider. °Insertion site care °· Follow instructions from your health care provider about how to take care of your insertion site. Make sure you: °? Wash your hands with soap and water before you change your bandage (dressing). If soap and water are not available, use hand sanitizer. °? Change your dressing as told by your health care provider. °? Leave stitches (sutures), skin glue, or adhesive strips in place. These skin closures may need to stay in place for 2 weeks or longer. If adhesive strip edges start to loosen and curl up, you may trim the loose edges. Do not remove adhesive strips completely unless your health care provider tells you to do that. °· Check your insertion site every day for signs of infection. Check for: °? Redness, swelling, or pain. °? Fluid or blood. °? Pus or a bad smell. °? Warmth. °· Do not take baths, swim, or use a hot tub until your health care provider approves. °· You may shower 24-48 hours after the procedure, or as directed by your health care provider. °? Remove the dressing and gently wash the site with plain soap and water. °? Pat the area dry with a clean towel. °? Do not rub the site. That could cause bleeding. °· Do not apply powder or lotion to the site. °Activity ° °· For 24 hours after the procedure, or as directed by your health care provider: °? Do not flex or bend the affected arm. °? Do not push or pull heavy objects with the affected arm. °? Do not  drive yourself home from the hospital or clinic. You may drive 24 hours after the procedure unless your health care provider tells you not to. °? Do not operate machinery or power tools. °· Do not lift anything that is heavier than 10 lb (4.5 kg), or the limit that you are told, until your health care provider says that it is safe. °· Ask your health care provider when it is okay to: °? Return to work or school. °? Resume usual physical activities or sports. °? Resume sexual activity. °General instructions °· If the catheter site starts to bleed, raise your arm and put firm pressure on the site. If the bleeding does not stop, get help right away. This is a medical emergency. °· If you went home on the same day as your procedure, a responsible adult should be with you for the first 24 hours after you arrive home. °· Keep all follow-up visits as told by your health care provider. This is important. °Contact a health care provider if: °· You have a fever. °· You have redness, swelling, or yellow drainage around your insertion site. °Get help right away if: °· You have unusual pain at the radial site. °· The catheter insertion area swells very fast. °· The insertion area is bleeding, and the bleeding does not stop when you hold steady pressure on the area. °· Your arm or hand becomes pale, cool, tingly, or numb. °These symptoms may represent a serious problem   that is an emergency. Do not wait to see if the symptoms will go away. Get medical help right away. Call your local emergency services (911 in the [place] Do not drive yourself to the hospital. °Summary °· After the procedure, it is common to have bruising and tenderness at the site. °· Follow instructions from your health care provider about how to take care of your radial site wound. Check the wound every day for signs of infection. °· Do not lift anything that is heavier than 10 lb (4.5 kg), or the limit that you are told, until your health care provider says  that it is safe. °This information is not intended to replace advice given to you by your health care provider. Make sure you discuss any questions you have with your health care provider. °Document Released: 02/26/2010 Document Revised: 03/01/2017 Document Reviewed: 03/01/2017 °Elsevier Patient Education © 2020 Elsevier Inc. ° °

## 2018-08-20 ENCOUNTER — Encounter (HOSPITAL_COMMUNITY): Payer: Self-pay | Admitting: Internal Medicine

## 2018-08-20 LAB — POCT I-STAT 7, (LYTES, BLD GAS, ICA,H+H)
Acid-base deficit: 2 mmol/L (ref 0.0–2.0)
Acid-base deficit: 4 mmol/L — ABNORMAL HIGH (ref 0.0–2.0)
Acid-base deficit: 4 mmol/L — ABNORMAL HIGH (ref 0.0–2.0)
Bicarbonate: 23.5 mmol/L (ref 20.0–28.0)
Bicarbonate: 21.8 mmol/L (ref 20.0–28.0)
Bicarbonate: 21.8 mmol/L (ref 20.0–28.0)
Calcium, Ion: 1.14 mmol/L — ABNORMAL LOW (ref 1.15–1.40)
Calcium, Ion: 0.87 mmol/L — CL (ref 1.15–1.40)
Calcium, Ion: 0.91 mmol/L — ABNORMAL LOW (ref 1.15–1.40)
HCT: 47 % (ref 39.0–52.0)
HCT: 40 % (ref 39.0–52.0)
HCT: 42 % (ref 39.0–52.0)
Hemoglobin: 16 g/dL (ref 13.0–17.0)
Hemoglobin: 13.6 g/dL (ref 13.0–17.0)
Hemoglobin: 14.3 g/dL (ref 13.0–17.0)
O2 Saturation: 97 %
O2 Saturation: 85 %
O2 Saturation: 86 %
Potassium: 3.9 mmol/L (ref 3.5–5.1)
Potassium: 3.2 mmol/L — ABNORMAL LOW (ref 3.5–5.1)
Potassium: 3.3 mmol/L — ABNORMAL LOW (ref 3.5–5.1)
Sodium: 142 mmol/L (ref 135–145)
Sodium: 145 mmol/L (ref 135–145)
Sodium: 146 mmol/L — ABNORMAL HIGH (ref 135–145)
TCO2: 25 mmol/L (ref 22–32)
TCO2: 23 mmol/L (ref 22–32)
TCO2: 23 mmol/L (ref 22–32)
pCO2 arterial: 40.1 mmHg (ref 32.0–48.0)
pCO2 arterial: 39.7 mmHg (ref 32.0–48.0)
pCO2 arterial: 39.7 mmHg (ref 32.0–48.0)
pH, Arterial: 7.376 (ref 7.350–7.450)
pH, Arterial: 7.348 — ABNORMAL LOW (ref 7.350–7.450)
pH, Arterial: 7.349 — ABNORMAL LOW (ref 7.350–7.450)
pO2, Arterial: 92 mmHg (ref 83.0–108.0)
pO2, Arterial: 52 mmHg — ABNORMAL LOW (ref 83.0–108.0)
pO2, Arterial: 54 mmHg — ABNORMAL LOW (ref 83.0–108.0)

## 2018-08-20 LAB — POCT I-STAT EG7
Acid-base deficit: 1 mmol/L (ref 0.0–2.0)
Acid-base deficit: 1 mmol/L (ref 0.0–2.0)
Bicarbonate: 24.6 mmol/L (ref 20.0–28.0)
Bicarbonate: 25.4 mmol/L (ref 20.0–28.0)
Calcium, Ion: 1.12 mmol/L — ABNORMAL LOW (ref 1.15–1.40)
Calcium, Ion: 1.22 mmol/L (ref 1.15–1.40)
HCT: 46 % (ref 39.0–52.0)
HCT: 48 % (ref 39.0–52.0)
Hemoglobin: 15.6 g/dL (ref 13.0–17.0)
Hemoglobin: 16.3 g/dL (ref 13.0–17.0)
O2 Saturation: 75 %
O2 Saturation: 75 %
Potassium: 3.7 mmol/L (ref 3.5–5.1)
Potassium: 4 mmol/L (ref 3.5–5.1)
Sodium: 141 mmol/L (ref 135–145)
Sodium: 141 mmol/L (ref 135–145)
TCO2: 26 mmol/L (ref 22–32)
TCO2: 27 mmol/L (ref 22–32)
pCO2, Ven: 45.1 mmHg (ref 44.0–60.0)
pCO2, Ven: 46.2 mmHg (ref 44.0–60.0)
pH, Ven: 7.345 (ref 7.250–7.430)
pH, Ven: 7.348 (ref 7.250–7.430)
pO2, Ven: 42 mmHg (ref 32.0–45.0)
pO2, Ven: 43 mmHg (ref 32.0–45.0)

## 2018-08-30 ENCOUNTER — Telehealth: Payer: Self-pay | Admitting: *Deleted

## 2018-08-30 NOTE — Telephone Encounter (Signed)
-----   Message from Sueanne Margarita, MD sent at 08/30/2018  2:27 PM EDT ----- Regarding: RE: New mask  Does he want a nasal mask or nasal pillow mask ----- Message ----- From: Freada Bergeron, CMA Sent: 08/30/2018   2:21 PM EDT To: Sueanne Margarita, MD Subject: New mask                                       Patient would like to change his mask to a nasal mask, please write the order. He is within his 30 day period to get a free mask.

## 2018-08-30 NOTE — Telephone Encounter (Signed)
Patient called today to ask for a new mask. Patient would like to change his mask to a nasal mask, please write the order. He is within his 30 day period to get a free mask.

## 2018-08-30 NOTE — Telephone Encounter (Signed)
I called his DME to clarify which mask and they said he doesn't need an order because he is within his 1st 30 days. The RT will call and fit him.

## 2018-09-28 ENCOUNTER — Ambulatory Visit (INDEPENDENT_AMBULATORY_CARE_PROVIDER_SITE_OTHER): Payer: 59 | Admitting: Cardiology

## 2018-09-28 ENCOUNTER — Other Ambulatory Visit: Payer: Self-pay

## 2018-09-28 ENCOUNTER — Encounter: Payer: Self-pay | Admitting: Cardiology

## 2018-09-28 VITALS — BP 118/78 | HR 82 | Ht 71.0 in | Wt 291.2 lb

## 2018-09-28 DIAGNOSIS — I272 Pulmonary hypertension, unspecified: Secondary | ICD-10-CM

## 2018-09-28 DIAGNOSIS — N289 Disorder of kidney and ureter, unspecified: Secondary | ICD-10-CM | POA: Diagnosis not present

## 2018-09-28 DIAGNOSIS — G4733 Obstructive sleep apnea (adult) (pediatric): Secondary | ICD-10-CM | POA: Diagnosis not present

## 2018-09-28 NOTE — Patient Instructions (Addendum)
Medication Instructions:   Your physician recommends that you continue on your current medications as directed. Please refer to the Current Medication list given to you today.   If you need a refill on your cardiac medications before your next appointment, please call your pharmacy.    Lab work:  TODAY-BMET  If you have labs (blood work) drawn today and your tests are completely normal, you will receive your results only by: Marland Kitchen. MyChart Message (if you have MyChart) OR . A paper copy in the mail If you have any lab test that is abnormal or we need to change your treatment, we will call you to review the results.    Follow-Up: At [facility], you and your health needs are our priority.  As part of our continuing mission to provide you with exceptional heart care, we have created designated Provider Care Teams.  These Care Teams include your primary Cardiologist (physician) and Advanced Practice Providers (APPs -  Physician Assistants and Nurse Practitioners) who all work together to provide you with the care you need, when you need it. You will need a follow up appointment in 6 months Dr. Eden EmmsNishan.  Please call our office 2 months in advance to schedule this appointment.  You may see Dr. Eden EmmsNishan or one of the following Advanced Practice Providers on your designated Care Team:   Norma FredricksonLori Gerhardt, NP Nada BoozerLaura Ingold, NP . Georgie ChardJill McDaniel, NP    Any Other Special Instructions Will Be Listed Below (If Applicable).    DASH Eating Plan DASH stands for "Dietary Approaches to Stop Hypertension." The DASH eating plan is a healthy eating plan that has been shown to reduce high blood pressure (hypertension). It may also reduce your risk for type 2 diabetes, heart disease, and stroke. The DASH eating plan may also help with weight loss. What are tips for following this plan?   General guidelines  Avoid eating more than 2,300 mg (milligrams) of salt (sodium) a day. If you have hypertension, you may  need to reduce your sodium intake to 1,500 mg a day.  Limit alcohol intake to no more than 1 drink a day for nonpregnant women and 2 drinks a day for men. One drink equals 12 oz of beer, 5 oz of wine, or 1 oz of hard liquor.  Work with your health care provider to maintain a healthy body weight or to lose weight. Ask what an ideal weight is for you.  Get at least 30 minutes of exercise that causes your heart to beat faster (aerobic exercise) most days of the week. Activities may include walking, swimming, or biking.  Work with your health care provider or diet and nutrition specialist (dietitian) to adjust your eating plan to your individual calorie needs. Reading food labels    Check food labels for the amount of sodium per serving. Choose foods with less than 5 percent of the Daily Value of sodium. Generally, foods with less than 300 mg of sodium per serving fit into this eating plan.  To find whole grains, look for the word "whole" as the first word in the ingredient list. Shopping  Buy products labeled as "low-sodium" or "no salt added."  Buy fresh foods. Avoid canned foods and premade or frozen meals. Cooking  Avoid adding salt when cooking. Use salt-free seasonings or herbs instead of table salt or sea salt. Check with your health care provider or pharmacist before using salt substitutes.  Do not fry foods. Cook foods using healthy methods such as  baking, boiling, grilling, and broiling instead.  Cook with heart-healthy oils, such as olive, canola, soybean, or sunflower oil. Meal planning  Eat a balanced diet that includes: ? 5 or more servings of fruits and vegetables each day. At each meal, try to fill half of your plate with fruits and vegetables. ? Up to 6-8 servings of whole grains each day. ? Less than 6 oz of lean meat, poultry, or fish each day. A 3-oz serving of meat is about the same size as a deck of cards. One egg equals 1 oz. ? 2 servings of low-fat dairy each  day. ? A serving of nuts, seeds, or beans 5 times each week. ? Heart-healthy fats. Healthy fats called Omega-3 fatty acids are found in foods such as flaxseeds and coldwater fish, like sardines, salmon, and mackerel.  Limit how much you eat of the following: ? Canned or prepackaged foods. ? Food that is high in trans fat, such as fried foods. ? Food that is high in saturated fat, such as fatty meat. ? Sweets, desserts, sugary drinks, and other foods with added sugar. ? Full-fat dairy products.  Do not salt foods before eating.  Try to eat at least 2 vegetarian meals each week.  Eat more home-cooked food and less restaurant, buffet, and fast food.  When eating at a restaurant, ask that your food be prepared with less salt or no salt, if possible. What foods are recommended? The items listed may not be a complete list. Talk with your dietitian about what dietary choices are best for you. Grains Whole-grain or whole-wheat bread. Whole-grain or whole-wheat pasta. Brown rice. [place] Morrow. Bulgur. Whole-grain and low-sodium cereals. Pita bread. Low-fat, low-sodium crackers. Whole-wheat flour tortillas. Vegetables Fresh or frozen vegetables (raw, steamed, roasted, or grilled). Low-sodium or reduced-sodium tomato and vegetable juice. Low-sodium or reduced-sodium tomato sauce and tomato paste. Low-sodium or reduced-sodium canned vegetables. Fruits All fresh, dried, or frozen fruit. Canned fruit in natural juice (without added sugar). Meat and other protein foods Skinless chicken or [place]. Ground chicken or [place]. Pork with fat trimmed off. Fish and seafood. Egg whites. Dried beans, peas, or lentils. Unsalted nuts, nut butters, and seeds. Unsalted canned beans. Lean cuts of beef with fat trimmed off. Low-sodium, lean deli meat. Dairy Low-fat (1%) or fat-free (skim) milk. Fat-free, low-fat, or reduced-fat cheeses. Nonfat, low-sodium ricotta or cottage cheese. Low-fat or nonfat yogurt.  Low-fat, low-sodium cheese. Fats and oils Soft margarine without trans fats. Vegetable oil. Low-fat, reduced-fat, or light mayonnaise and salad dressings (reduced-sodium). Canola, safflower, olive, soybean, and sunflower oils. Avocado. Seasoning and other foods Herbs. Spices. Seasoning mixes without salt. Unsalted popcorn and pretzels. Fat-free sweets. What foods are not recommended? The items listed may not be a complete list. Talk with your dietitian about what dietary choices are best for you. Grains Baked goods made with fat, such as croissants, muffins, or some breads. Dry pasta or rice meal packs. Vegetables Creamed or fried vegetables. Vegetables in a cheese sauce. Regular canned vegetables (not low-sodium or reduced-sodium). Regular canned tomato sauce and paste (not low-sodium or reduced-sodium). Regular tomato and vegetable juice (not low-sodium or reduced-sodium). Angie Fava. Olives. Fruits Canned fruit in a light or heavy syrup. Fried fruit. Fruit in cream or butter sauce. Meat and other protein foods Fatty cuts of meat. Ribs. Fried meat. Berniece Salines. Sausage. Bologna and other processed lunch meats. Salami. Fatback. Hotdogs. Bratwurst. Salted nuts and seeds. Canned beans with added salt. Canned or smoked fish. Whole eggs or egg yolks.  Chicken or [place] with skin. Dairy Whole or 2% milk, cream, and half-and-half. Whole or full-fat cream cheese. Whole-fat or sweetened yogurt. Full-fat cheese. Nondairy creamers. Whipped toppings. Processed cheese and cheese spreads. Fats and oils Butter. Stick margarine. Lard. Shortening. Ghee. Bacon fat. Tropical oils, such as coconut, palm kernel, or palm oil. Seasoning and other foods Salted popcorn and pretzels. Onion salt, garlic salt, seasoned salt, table salt, and sea salt. Worcestershire sauce. Tartar sauce. Barbecue sauce. Teriyaki sauce. Soy sauce, including reduced-sodium. Steak sauce. Canned and packaged gravies. Fish sauce. Oyster sauce. Cocktail  sauce. Horseradish that you find on the shelf. Ketchup. Mustard. Meat flavorings and tenderizers. Bouillon cubes. Hot sauce and Tabasco sauce. Premade or packaged marinades. Premade or packaged taco seasonings. Relishes. Regular salad dressings. Where to find more information:  National Heart, Lung, and Blood Institute: PopSteam.iswww.nhlbi.nih.gov  American Heart Association: www.heart.org Summary  The DASH eating plan is a healthy eating plan that has been shown to reduce high blood pressure (hypertension). It may also reduce your risk for type 2 diabetes, heart disease, and stroke.  With the DASH eating plan, you should limit salt (sodium) intake to 2,300 mg a day. If you have hypertension, you may need to reduce your sodium intake to 1,500 mg a day.  When on the DASH eating plan, aim to eat more fresh fruits and vegetables, whole grains, lean proteins, low-fat dairy, and heart-healthy fats.  Work with your health care provider or diet and nutrition specialist (dietitian) to adjust your eating plan to your individual calorie needs. This information is not intended to replace advice given to you by your health care provider. Make sure you discuss any questions you have with your health care provider. Document Released: 01/13/2011 Document Revised: 01/06/2017 Document Reviewed: 01/18/2016 Elsevier Patient Education  2020 [employer]

## 2018-09-28 NOTE — Progress Notes (Signed)
Cardiology Office Note   Date:  09/28/2018   ID:  Levi Nelson 1955/07/07, MRN [id number]  PCP:  Levi Code, DO  Cardiologist:  Levi Nelson     Chief Complaint  Patient presents with  . Shortness of Breath      History of Present Illness: Levi Nelson is a 63 y.o. male who presents for post cath visit.   With hx of cardiomegaly Referred by primary MD Levi Balboa DO and Levi Homer PA in [place] to Levi Nelson.  He also sees pulmonary there Dr Levi Nelson and Dr. Lamonte Nelson here.  He has significant COPD He is concerned that he has an enlarged heart which is causing chronic hypoxemic respiratory failure Sats normally around 90% Uses oxygen 2L's Quit smoking in 2010. He has a 8 mm nodule in LLL being followed by CT most recent 12/01/17   He indicates being admitted to hospital in October for 24 hours being diuresed and sent home on oxygen which he has been on ever since.   I have no documentation of PFTls, R/O shunt CTA or V/Q to r/o PE. He has no idea What his diagnosis is that is causing hypoxic respiratory failure   Uses oxygen 2L's Quit smoking in 2010 He has a 8 mm nodule in LLL being followed by CT most recent 12/01/17  Echo 3/20 with LVEF 55-60% with moderate RV dilation and normal function. No significant TR so RVSP could not be assessed.   Rt and Lt cardiac cath with normal coronary arteries EF 60-65%, moderate PAH with normal Lt sided pressures and no evidence of RV strain.  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 Needs weight loss and CPAP.   Wt is up some and he knows this and plans to resume diet.  We discussed DASH diet.  He is exercising 10 min on treadmill daily, discussed increasing.  No chest pain and no SOB. Rare lower ext edema.     Past Medical History:  Diagnosis Date  . Body mass index (BMI) 40.0-44.9, adult ([place])   . Cardiomegaly   . Chronic hypoxemic respiratory failure ([place])   . Chronic respiratory failure  with hypoxia ([place])   . DM (diabetes mellitus) ([place])   . Hypoxemia     Past Surgical History:  Procedure Laterality Date  . COLONSCOPY  02/07/2010  . KNEE SURGERY Left   . RIGHT/LEFT HEART CATH AND CORONARY ANGIOGRAPHY N/A 08/17/2018   Procedure: RIGHT/LEFT HEART CATH AND CORONARY ANGIOGRAPHY;  Surgeon: Levi Artist, MD;  Location: [facility] [facility];  Service: Cardiovascular;  Laterality: N/A;  . 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 amoxicillin (AMOXIL) 500 MG capsule Take 500 mg by mouth. For dental purposes only    . Dulaglutide (TRULICITY) 4.97 WY/6.3ZC SOPN Inject 0.75 mg into the skin every Wednesday.     . furosemide (LASIX) 20 MG tablet Take 20 mg by mouth daily.    . OXYGEN Inhale 2 L into the lungs continuous.      No current facility-administered medications for this visit.     Allergies:   Patient has no known allergies.    Social History:  The patient  reports that he has quit smoking. His smoking use included cigarettes. He quit after 25.00 years of use. He has never used smokeless tobacco. He reports that he does not drink alcohol or use drugs.  Family History:  The patient's family history includes Arthritis in his father, mother, and sister.    ROS:  General:no colds or fevers, no weight changes Skin:no rashes or ulcers HEENT:no blurred vision, no congestion CV:see HPI PUL:see HPI GI:no diarrhea constipation or melena, no indigestion GU:no hematuria, no dysuria MS:no joint pain, no claudication Neuro:no syncope, no lightheadedness Endo:no diabetes, no thyroid disease Wt Readings from Last 3 Encounters:  09/28/18 291 lb 3.2 oz (132.1 kg)  08/17/18 270 lb (122.5 kg)  07/20/18 280 lb (127 kg)     PHYSICAL EXAM: VS:  BP 118/78   Pulse 82   Ht 5\' 11"  (1.803 m)   Wt 291 lb 3.2 oz (132.1 kg)   SpO2 94%   BMI 40.61 kg/m  , BMI Body mass index is 40.61 kg/m.  General:Pleasant affect, NAD Skin:Warm and dry, brisk capillary refill HEENT:normocephalic, sclera clear, mucus membranes moist Neck:supple, no JVD, no bruits  Heart:S1S2 RRR without murmur, gallup, rub or click Lungs:clear without rales, rhonchi, or wheezes XBJ:YNWGAbd:soft, non tender, + BS, do not palpate liver spleen or masses Ext:no lower ext edema, 2+ pedal pulses, 2+ radial pulses Neuro:alert and oriented, MAE, follows commands, + facial symmetry    EKG:  EKG is NOT ordered today.    Recent Labs: 08/17/2018: BUN 11; Creatinine, Ser 0.97; Hemoglobin 14.3; Platelets 207; Potassium 3.3; Sodium 145    Lipid Panel No results found for: CHOL, TRIG, HDL, CHOLHDL, VLDL, LDLCALC, LDLDIRECT     Other studies Reviewed: Additional studies/ records that were reviewed today include: .  Rt and lt cardiac cath Findings:  Ao - 104/65 (84) LV = 112/8 RA = 5 RV = 74/6 PA = 68/25 (41) PCW = 11 Fick cardiac output/index = 6.7/2.8 PVR = 4.5 WU FA sat = 97% PA sat = 75%, 75% SVC sat = 85%  Assessment: 1. 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 Needs weight loss and CPAP.   Levi Meresaniel Bensimhon, MD  11:42 AM  Echo 04/19/18 IMPRESSIONS    1. The left ventricle has normal systolic function, with an ejection fraction of 55-60%. The cavity size was normal. Left ventricular diastolic parameters were normal. There is right ventricular pressure overload. No evidence of left ventricular  regional wall motion abnormalities.  2. The right ventricle has normal systolic function. The cavity was moderately enlarged. There is no increase in right ventricular wall thickness. Right ventricular systolic pressure could not be assessed.  3. Right atrial size was mildly dilated.  4. Small pericardial effusion.  5. The aortic valve is tricuspid  Aortic valve regurgitation was not assessed by color flow Doppler.  SUMMARY   Normal LV systolic and diastolic function. No significant valve disease. Unable to estimate right sided pressures due to lack of TR jet, but D-shaped septum suggests elevated right ventricular pressures. IVC collapses, consistent with normal CVP.  FINDINGS  Left Ventricle: The left ventricle has normal systolic function, with an ejection fraction of 55-60%. The cavity size was normal. There is no increase in left ventricular wall thickness. Left ventricular diastolic parameters were normal. There is the  interventricular septum is flattened in systole, consistent with right ventricular pressure overload. No evidence of left ventricular regional wall motion abnormalities.. Right Ventricle: The right ventricle has normal systolic function. The cavity was moderately enlarged. There is no increase in right ventricular wall  thickness. Right ventricular systolic pressure could not be assessed. Left Atrium: left atrial size was normal in size Right Atrium: right atrial size was mildly dilated. Right atrial pressure is estimated at 3 mmHg. Interatrial Septum: No atrial level shunt detected by color flow Doppler. Pericardium: A small pericardial effusion is present. Mitral Valve: The mitral valve is normal in structure. Mitral valve regurgitation is trivial by color flow Doppler. Tricuspid Valve: The tricuspid valve is normal in structure. Tricuspid valve regurgitation is trivial by color flow Doppler. Aortic Valve: The aortic valve is tricuspid Aortic valve regurgitation was not assessed by color flow Doppler. Pulmonic Valve: The pulmonic valve was grossly normal. Pulmonic valve regurgitation is mild by color flow Doppler. Venous: The inferior vena cava is normal in size with greater than 50% respiratory variability. ASSESSMENT AND PLAN:  1.  PAH on home 02 and controlled BP.  No SOB or pain.  Follow up in 6 months with Dr.  Eden EmmsNishan   2.  Normal coronary arteries on cath.   3.  Renal insuff recheck BMP post cath  4.  OSA on cpap followed by pulmonary   5.  Morbid obesity discussed DASH diet and increase exercise.    Current medicines are reviewed with the patient today.  The patient Has no concerns regarding medicines.  The following changes have been made:  See above Labs/ tests ordered today include:see above  Disposition:   FU:  see above  Signed, Nada BoozerLaura Jenisis Harmsen, NP  09/28/2018 3:26 PM    [facility] [address], [place], [place]  [address]/ [address] [place], [place] Phone: [phone]; Fax: [phone]  [phone]

## 2018-09-29 LAB — BASIC METABOLIC PANEL
BUN/Creatinine Ratio: 11 (ref 10–24)
BUN: 13 mg/dL (ref 8–27)
CO2: 23 mmol/L (ref 20–29)
Calcium: 9.7 mg/dL (ref 8.6–10.2)
Chloride: 103 mmol/L (ref 96–106)
Creatinine, Ser: 1.15 mg/dL (ref 0.76–1.27)
GFR calc Af Amer: 77 mL/min/{1.73_m2} (ref >59)
GFR calc non Af Amer: 67 mL/min/{1.73_m2} (ref >59)
Glucose: 83 mg/dL (ref 65–99)
Potassium: 4.5 mmol/L (ref 3.5–5.2)
Sodium: 140 mmol/L (ref 134–144)

## 2018-10-05 NOTE — Telephone Encounter (Signed)
Patient has a 10 week follow up appointment scheduled for  °Patient understands she needs to keep this appointment for insurance compliance. °Patient was grateful for the call and thanked me.  ° °

## 2018-10-09 ENCOUNTER — Telehealth: Payer: Self-pay | Admitting: *Deleted

## 2018-10-09 NOTE — Telephone Encounter (Signed)

## 2018-10-15 NOTE — Progress Notes (Signed)
Virtual Visit via Video Note   This visit type was conducted due to national recommendations for restrictions regarding the COVID-19 Pandemic (e.g. social distancing) in an effort to limit this patient's exposure and mitigate transmission in our community.  Due to his co-morbid illnesses, this patient is at least at moderate risk for complications without adequate follow up.  This format is felt to be most appropriate for this patient at this time.  All issues noted in this document were discussed and addressed.  A limited physical exam was performed with this format.  Please refer to the patient's chart for his consent to telehealth for Levi Nelson.   Evaluation Performed:  Follow-up visit  This visit type was conducted due to national recommendations for restrictions regarding the COVID-19 Pandemic (e.g. social distancing).  This format is felt to be most appropriate for this patient at this time.  All issues noted in this document were discussed and addressed.  No physical exam was performed (except for noted visual exam findings with Video Visits).  Please refer to the patient's chart (MyChart message for video visits and phone note for telephone visits) for the patient's consent to telehealth for Levi Nelson.  Date:  10/16/2018   ID:  Levi, Nelson 1955-10-20, MRN [id number]  Patient Location:  Home  Provider location:   [name]  PCP:  [name], DO  Sleep Medicine:  Fransico Him, MD Electrophysiologist:  None   Chief Complaint:  OSA  History of Present Illness:    Levi Nelson is a 63 y.o. male who presents via audio/video conferencing for a telehealth visit today.    This is a 63yo obese male with a hx of CM and cor pulmonale who was referred for sleep study by Dr. Johnsie Cancel.  He underwent PSG showing Upper airway resistance syndrome with overall AHI of 0.2/hr and RDI of 24/hr.  He had significant nocturnal hypoxemia and in setting of cor pulmonale was placed  on auto CPAP.  He is doing well with his CPAP device and thinks that he has gotten used to it.  He tolerates the mask and feels the pressure is adequate.  Since going on CPAP he feels rested in the am and has no significant daytime sleepiness.  He denies any significant  nasal dryness or nasal congestion. He does have some mild mouth dryness and uses a nasal mask.   He does not think that he snores.    The patient does not have symptoms concerning for COVID-19 infection (fever, chills, cough, or new shortness of breath).   Prior CV studies:   The following studies were reviewed today:  PAP compliance download  Past Medical History:  Diagnosis Date   Body mass index (BMI) 40.0-44.9, adult (HCC)    Cardiomegaly    Chronic hypoxemic respiratory failure (HCC)    Chronic respiratory failure with hypoxia (HCC)    DM (diabetes mellitus) ([place])    Hypoxemia    Past Surgical History:  Procedure Laterality Date   COLONSCOPY  02/07/2010   KNEE SURGERY Left    RIGHT/LEFT HEART CATH AND CORONARY ANGIOGRAPHY N/A 08/17/2018   Procedure: RIGHT/LEFT HEART CATH AND CORONARY ANGIOGRAPHY;  Surgeon: Jolaine Artist, MD;  Location: [facility];  Service: Cardiovascular;  Laterality: N/A;   TOTAL KNEE ARTHROPLASTY Right 10/08/2016     Current Meds  Medication Sig   amoxicillin (AMOXIL) 500 MG capsule Take 500 mg by mouth. For dental purposes only   Dulaglutide (TRULICITY)  0.75 MG/0.5ML SOPN Inject 0.75 mg into the skin every Wednesday.    furosemide (LASIX) 20 MG tablet Take 20 mg by mouth daily.   OXYGEN Inhale 2 L into the lungs continuous.      Allergies:   Patient has no known allergies.   Social History   Tobacco Use   Smoking status: Former Smoker    Years: 25.00    Types: Cigarettes   Smokeless tobacco: Never Used  Substance Use Topics   Alcohol use: Never    Frequency: Never   Drug use: Never     Family Hx: The patient's family history includes Arthritis  in his father, mother, and sister.  ROS:   Please see the history of present illness.     All other systems reviewed and are negative.   Labs/Other Tests and Data Reviewed:    Recent Labs: 08/17/2018: Hemoglobin 14.3; Platelets 207 09/28/2018: BUN 13; Creatinine, Ser 1.15; Potassium 4.5; Sodium 140   Recent Lipid Panel No results found for: CHOL, TRIG, HDL, CHOLHDL, LDLCALC, LDLDIRECT  Wt Readings from Last 3 Encounters:  10/16/18 281 lb (127.5 kg)  09/28/18 291 lb 3.2 oz (132.1 kg)  08/17/18 270 lb (122.5 kg)     Objective:    Vital Signs:  Pulse 84    Ht 5\' 11"  (1.803 m)    Wt 281 lb (127.5 kg)    SpO2 90%    BMI 39.19 kg/m    CONSTITUTIONAL:  Well nourished, well developed male in no acute distress.  EYES: anicteric MOUTH: oral mucosa is pink RESPIRATORY: Normal respiratory effort, symmetric expansion CARDIOVASCULAR: No peripheral edema SKIN: No rash, lesions or ulcers MUSCULOSKELETAL: no digital cyanosis NEURO: Cranial Nerves II-XII grossly intact, moves all extremities PSYCH: Intact judgement and insight.  A&O x 3, Mood/affect appropriate   ASSESSMENT & PLAN:    1.  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  The patient is tolerating PAP therapy well without any problems. The PAP download was reviewed today and showed an AHI of 3.4/hr on auto PAP cm H2O with 100% compliance in using more than 4 hours nightly.  The patient has been using and benefiting from PAP use and will continue to benefit from therapy. I am going to get an overnight pulse ox to make sure he has no residual nocturnal hypoxemia.  2.  Morbid Obesity - I have encouraged him to get into a routine exercise program and cut back on carbs and portions.   3. Cardiomegaly with cor pulmonale - likely due to  COPD as well as obesity hypoventilation syndrome and upper airway resistance syndrome with hypoxia. Encouraged to be compliant with CPAP.  Following with pulmonary and Cards.    COVID-19 Education: The signs and symptoms of COVID-19 were discussed with the patient and how to seek care for testing (follow up with PCP or arrange E-visit).  The importance of social distancing was discussed today.  Patient Risk:   After full review of this patient's clinical status, I feel that they are at least moderate risk at this time.  Time:   Today, I have spent 20 minutes directly with the patient on telemedicine discussing medical problems including OSA.  We also reviewed the symptoms of COVID 19 and the ways to protect against contracting the virus with telehealth technology.  I spent an additional 5  minutes reviewing patient's chart including PAP compliance download.  Medication Adjustments/Labs and Tests Ordered: Current medicines are reviewed at length with the patient today.  Concerns regarding medicines are outlined above.  Tests Ordered: No orders of the defined types were placed in this encounter.  Medication Changes: No orders of the defined types were placed in this encounter.   Disposition:  Follow up in 1 year(s)  Signed, Armanda Magicraci Kailiana Granquist, MD  10/16/2018 2:27 PM    [facility]

## 2018-10-16 ENCOUNTER — Other Ambulatory Visit: Payer: Self-pay

## 2018-10-16 ENCOUNTER — Telehealth: Payer: Self-pay | Admitting: *Deleted

## 2018-10-16 ENCOUNTER — Telehealth (INDEPENDENT_AMBULATORY_CARE_PROVIDER_SITE_OTHER): Payer: 59 | Admitting: Cardiology

## 2018-10-16 ENCOUNTER — Encounter: Payer: Self-pay | Admitting: Cardiology

## 2018-10-16 VITALS — HR 84 | Ht 71.0 in | Wt 281.0 lb

## 2018-10-16 DIAGNOSIS — R0902 Hypoxemia: Secondary | ICD-10-CM

## 2018-10-16 DIAGNOSIS — G478 Other sleep disorders: Secondary | ICD-10-CM

## 2018-10-16 DIAGNOSIS — I2781 Cor pulmonale (chronic): Secondary | ICD-10-CM

## 2018-10-16 NOTE — Patient Instructions (Signed)
Medication Instructions:  Your physician recommends that you continue on your current medications as directed. Please refer to the Current Medication list given to you today.  If you need a refill on your cardiac medications before your next appointment, please call your pharmacy.   Lab work: None If you have labs (blood work) drawn today and your tests are completely normal, you will receive your results only by: Marland Kitchen MyChart Message (if you have MyChart) OR . A paper copy in the mail If you have any lab test that is abnormal or we need to change your treatment, we will call you to review the results.  Testing/Procedures: Your physician recommends that you have a overnight pulse oximetry performed.   Follow-Up: At [facility], you and your health needs are our priority.  As part of our continuing mission to provide you with exceptional heart care, we have created designated Provider Care Teams.  These Care Teams include your primary Cardiologist (physician) and Advanced Practice Providers (APPs -  Physician Assistants and Nurse Practitioners) who all work together to provide you with the care you need, when you need it. You will need a follow up appointment in 12 months.  Please call our office 2 months in advance to schedule this appointment.  You may see Dr. Radford Pax or one of the following Advanced Practice Providers on your designated Care Team:   Smiley, PA-C Melina Copa, PA-C . Ermalinda Barrios, PA-C  Any Other Special Instructions Will Be Listed Below (If Applicable).

## 2018-10-16 NOTE — Telephone Encounter (Signed)
-----   Message from Loren Racer, RN sent at 10/16/2018  2:47 PM EDT -----  ----- Message ----- From: Sueanne Margarita, MD Sent: 10/16/2018   2:29 PM EDT To: Loren Racer, RN  Overnight pulse ox on CPAP to assess for residual nocturnal hypoxemia.  Followup with me in 1 year

## 2018-10-16 NOTE — Telephone Encounter (Signed)
Order placed today to [facility]

## 2018-10-31 ENCOUNTER — Other Ambulatory Visit: Payer: Self-pay

## 2018-10-31 ENCOUNTER — Encounter: Payer: Self-pay | Admitting: Emergency Medicine

## 2018-10-31 ENCOUNTER — Ambulatory Visit: Payer: 59 | Admitting: Emergency Medicine

## 2018-10-31 DIAGNOSIS — I2729 Other secondary pulmonary hypertension: Secondary | ICD-10-CM

## 2018-10-31 NOTE — Patient Instructions (Signed)
Please continue your CPAP every night as you have been wearing it. Continue oxygen at 2 L/min at all times. We will not start any inhaled medication at this time. Continue your lasix and follow up as planned with Cardiology Follow with Dr. Lamonte Sakai in 12 months or sooner if you have any problems.

## 2018-10-31 NOTE — Assessment & Plan Note (Addendum)
Severe secondary pulmonary hypertension that is much better compensated currently.  Contributors identified are significant obstructive sleep apnea, daytime hypoxemia due to OHS.  He has good compliance with both his CPAP and his oxygen.  He is on a stable diuretic regimen and is following with cardiology.  Likely will follow his PAP with echocardiograms.  No evidence for thrombolic disease on V/Q or interstitial disease on chest x-ray.  He has minimal tobacco exposure, did not benefit from empiric Anoro.  Unclear that there is any real value in performing PFT right now.  He has an elevated RF, borderline positive ANA, both of unclear significance.  No other stigmata of rheumatoid disease.  Unclear whether this could be contributing to [facility]. Would follow his clinical status, PAP.  If he fails to continue to respond well to treatment of his OSA/OHS then could revisit any possible contribution of autoimmune disease.  Unclear to me that targeted [place] medication would be beneficial at this time.  I encouraged him to start working on his exercise, conditioning and weight loss.  He is motivated.

## 2018-10-31 NOTE — Progress Notes (Signed)
Subjective:    Patient ID: Levi Nelson, male    DOB: 1955/07/02, 63 y.o.   MRN: [id number]  HPI 63 year old obese man, former smoker (6-7 pack years) with diabetes, knee replacement,  possible COPD who was found to be hypoxemic on hospital admission to [facility] 11/2017.  It sounds like he probably had evidence for cor pulmonale and volume overload, was diuresed.  He was discharged on 2 L/min. He was tried empirically on Anoro after the hospitalization - no real change. He is on a stable dose lasix since the hospitalization. Never on diet meds.   He can get dyspnea with exertion, chores. No real cough.   He has never had a sleep study, is scheduled for a split-night study in May 2020  Review of his cardiology notes indicates that he had CT scan of his chest done in October 2019, had an 8 mm left lower lobe nodule.  I do not have those films available currently.  TTE 04/20/2018 >> intact LV fxn, evidence for RV strain and PAH. PASP wasn't estimated. Valves OK.    ROV 10/31/2018 --63 year old gentleman whom I met following a hospitalization for what sounded like decompensated cor pulmonale with significant secondary pulmonary hypertension.  Work-up so far has included sleep study that identified obstructive sleep apnea. He has gotten CPAP since last time, uses a nasal mask. He is using 2L/min. Feels that his breathing is doing fairly well - will get dyspnea with yard work. Remains on lasix '20mg'$  daily. Never benefited from Anoro.   V/Q scan 07/10/2018 reviewed by me showed no infiltrates on chest x-ray, low probability for acute or chronic clot. His labs show slightly abnormal ANA, dilution 1: 40, nuclear, speckled.  Also his RF is elevated at 33.  Unclear significance in the absence of joint disease.  He also underwent a right and left heart catheterization on 08/17/2018 that identified normal coronaries, PAP 68/25 (41 mmHg) with normal left-sided pressures, no evidence of RV strain on that  study.   Review of Systems  Constitutional: Negative for fever and unexpected weight change.  HENT: Negative for congestion, dental problem, ear pain, nosebleeds, postnasal drip, rhinorrhea, sinus pressure, sneezing, sore throat and trouble swallowing.   Eyes: Negative for redness and itching.  Respiratory: Positive for shortness of breath. Negative for cough, chest tightness and wheezing.   Cardiovascular: Negative for palpitations and leg swelling.  Gastrointestinal: Negative for nausea and vomiting.  Genitourinary: Negative for dysuria.  Musculoskeletal: Negative for joint swelling.  Skin: Negative for rash.  Neurological: Negative for headaches.  Hematological: Does not bruise/bleed easily.  Psychiatric/Behavioral: Negative for dysphoric mood. The patient is not nervous/anxious.     Past Medical History:  Diagnosis Date  . Body mass index (BMI) 40.0-44.9, adult ([place])   . Cardiomegaly   . Chronic hypoxemic respiratory failure ([place])   . Chronic respiratory failure with hypoxia ([place])   . DM (diabetes mellitus) ([place])   . Hypoxemia      Family History  Problem Relation Age of Onset  . Arthritis Mother   . Arthritis Father   . Arthritis Sister   No hx PAH or pulm disease.   Social History   Socioeconomic History  . Marital status: Married    Spouse name: Not on file  . Number of children: Not on file  . Years of education: Not on file  . Highest education level: Not on file  Occupational History  . Not on file  Social Needs  . Financial  resource strain: Not on file  . Food insecurity    Worry: Not on file    Inability: Not on file  . Transportation needs    Medical: Not on file    Non-medical: Not on file  Tobacco Use  . Smoking status: Former Smoker    Years: 25.00    Types: Cigarettes  . Smokeless tobacco: Never Used  Substance and Sexual Activity  . Alcohol use: Never    Frequency: Never  . Drug use: Never  . Sexual activity: Not on file  Lifestyle  .  Physical activity    Days per week: Not on file    Minutes per session: Not on file  . Stress: Not on file  Relationships  . Social Herbalist on phone: Not on file    Gets together: Not on file    Attends religious service: Not on file    Active member of club or organization: Not on file    Attends meetings of clubs or organizations: Not on file    Relationship status: Not on file  . Intimate partner violence    Fear of current or ex partner: Not on file    Emotionally abused: Not on file    Physically abused: Not on file    Forced sexual activity: Not on file  Other Topics Concern  . Not on file  Social History Narrative  . 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   [place] native. Lives in [place].   No Known Allergies   Outpatient Medications Prior to Visit  Medication Sig Dispense Refill  . amoxicillin (AMOXIL) 500 MG capsule Take 500 mg by mouth. For dental purposes only    . Dulaglutide (TRULICITY) 0.32 ZY/2.4MG SOPN Inject 0.75 mg into the skin every Wednesday.     . furosemide (LASIX) 20 MG tablet Take 20 mg by mouth daily.    . OXYGEN Inhale 2 L into the lungs continuous.      No facility-administered medications prior to visit.         Objective:   Physical Exam  Vitals:   10/31/18 1545  BP: 120/86  Pulse: 97  SpO2: 100%  Weight: 293 lb (132.9 kg)   Gen: Pleasant, obese gentleman, in no distress,  normal affect  ENT: No lesions,  mouth clear,  oropharynx clear, no postnasal drip  Neck: No JVD, no stridor  Lungs: No use of accessory muscles, no crackles or wheezing on normal respiration, no wheeze on forced expiration  Cardiovascular: RRR, heart sounds normal, no murmur or gallops, trace lower extremity peripheral edema  Musculoskeletal: No deformities, no cyanosis or clubbing  Neuro: alert, awake, non focal  Skin: Warm, no lesions  or rash       Assessment & Plan:  Other secondary pulmonary hypertension (HCC) Severe secondary pulmonary hypertension that is much better compensated currently.  Contributors identified are significant obstructive sleep apnea, daytime hypoxemia due to OHS.  He has good compliance with both his CPAP and his oxygen.  He is on a stable diuretic regimen and is following with cardiology.  Likely will follow his PAP with echocardiograms.  No evidence for thrombolic disease on V/Q or interstitial disease on chest x-ray.  He has minimal tobacco exposure, did not benefit from empiric Anoro.  Unclear that there is any real value in performing PFT right now.  He has an elevated RF, borderline positive ANA, both of unclear significance.  No other stigmata of rheumatoid disease.  Unclear whether this could be contributing to [facility]. Would follow his clinical status, PAP.  If he fails to continue to respond well to treatment of his OSA/OHS then could revisit any possible contribution of autoimmune disease.  Unclear to me that targeted [place] medication would be beneficial at this time.  I encouraged him to start working on his exercise, conditioning and weight loss.  He is motivated.   Baltazar Apo, MD, PhD 10/31/2018, 4:19 PM [facility] [phone] or if no answer [phone]

## 2019-03-18 NOTE — Progress Notes (Signed)
Cardiology Office Note   Date:  03/27/2019   ID:  Levi Nelson [birth year], MRN [id number]  PCP:  Levi Code, DO  Cardiologist:  Dr. Johnsie Cancel     No chief complaint on file.     History of Present Illness: Levi Nelson is a 64 y.o. male  First seen in consult 04/11/18.  Has severe COPD now followed by Dr Lamonte Sakai. Quit smoking in 2010. Chronic hypoxemic respiratory failure sats typically 90% on 2 L's Dr Lamonte Sakai also follows 8 mm LLL nodule no change on most recent CT 12/01/17    Echo 04/27/18  with LVEF 55-60% with moderate RV dilation and normal function. No significant TR so RVSP could not be assessed.   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 Needs weight loss and CPAP.  Not recommended for vasodilators by DB V/Q scan negative 07/10/18 CXR with expected PA Enlargement   Seen by Dr Radford Pax for OSA 10/16/18 Compliant with CPAP  Dr Lamonte Sakai noted minimum tobacco exposure and no need for PFTls No obvious autoimmune contribution   He is exercising 10 min on treadmill daily, discussed increasing.  No chest pain and no SOB. Rare lower ext edema.    Was inquiring about an air cleaning system that seemed like a nebulizer Told him to ask Dr Lamonte Sakai about it   Past Medical History:  Diagnosis Date  . Body mass index (BMI) 40.0-44.9, adult   . Cardiomegaly   . Chronic hypoxemic respiratory failure ([place])   . Chronic respiratory failure with hypoxia ([place])   . DM (diabetes mellitus) ([place])   . Hypoxemia     Past Surgical History:  Procedure Laterality Date  . COLONSCOPY  02/07/2010  . KNEE SURGERY Left   . RIGHT/LEFT HEART CATH AND CORONARY ANGIOGRAPHY N/A 08/17/2018   Procedure: RIGHT/LEFT HEART CATH AND CORONARY ANGIOGRAPHY;  Surgeon: Jolaine Artist, MD;  Location: [facility];  Service: Cardiovascular;  Laterality:  N/A;  . 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 amoxicillin (AMOXIL) 500 MG capsule Take 500 mg by mouth. For dental purposes only    . Dulaglutide (TRULICITY) 5.42 HC/6.2BJ SOPN Inject 0.75 mg into the skin every Wednesday.     . furosemide (LASIX) 20 MG tablet Take 20 mg by mouth daily.    . OXYGEN Inhale 2 L into the lungs continuous.      No current facility-administered medications for this visit.    Allergies:   Patient has no known allergies.    Social History:  The patient  reports that he has quit smoking. His smoking use included cigarettes. He quit after 25.00 years of use. He has never used smokeless tobacco. He reports that he does not drink alcohol or use drugs.   Family History:  The patient's family history includes Arthritis in his father, mother, and sister.    ROS:  General:no colds or fevers, no weight changes Skin:no rashes or ulcers HEENT:no blurred vision, no congestion CV:see HPI PUL:see HPI GI:no diarrhea constipation or melena, no indigestion GU:no hematuria, no dysuria MS:no joint pain, no claudication Neuro:no syncope, no lightheadedness Endo:no diabetes, no thyroid disease Wt Readings from Last 3 Encounters:  03/27/19 297 lb 12.8 oz (135.1 kg)  10/31/18 293 lb (132.9 kg)  10/16/18 281 lb (127.5 kg)     PHYSICAL EXAM: VS:  BP 132/84   Pulse 82   Ht 5\' 11"  (1.803 m)   Wt 297 lb 12.8 oz (135.1 kg)   SpO2 93%   BMI 41.53 kg/m  , BMI Body mass index is 41.53 kg/m.  Affect appropriate Obese male  HEENT: normal Neck supple with no adenopathy JVP normal no bruits no thyromegaly Lungs clear with no wheezing and good diaphragmatic motion Heart:  S1/S2 no murmur, no rub, gallop or click PMI normal Abdomen: benighn, BS positve, no tenderness, no AAA no bruit.  No HSM or HJR Distal pulses intact with no bruits No edema Neuro non-focal Skin warm and dry No muscular  weakness    EKG:   SR prominent R wave in V1-3 suggesting pulmonary HTN    Recent Labs: 08/17/2018: Hemoglobin 14.3; Platelets 207 09/28/2018: BUN 13; Creatinine, Ser 1.15; Potassium 4.5; Sodium 140    Lipid Panel No results found for: CHOL, TRIG, HDL, CHOLHDL, VLDL, LDLCALC, LDLDIRECT     Other studies Reviewed: Additional studies/ records that were reviewed today include: .  Rt and lt cardiac cath Findings:  Ao - 104/65 (84) LV = 112/8 RA = 5 RV = 74/6 PA = 68/25 (41) PCW = 11 Fick cardiac output/index = 6.7/2.8 PVR = 4.5 WU FA sat = 97% PA sat = 75%, 75% SVC sat = 85%  Assessment: 1. 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 Needs weight loss and CPAP.   09/30/2018, MD  11:42 AM  Echo 04/19/18 IMPRESSIONS    1. The left ventricle has normal systolic function, with an ejection fraction of 55-60%. The cavity size was normal. Left ventricular diastolic parameters were normal. There is right ventricular pressure overload. No evidence of left ventricular  regional wall motion abnormalities.  2. The right ventricle has normal systolic function. The cavity was moderately enlarged. There is no increase in right ventricular wall thickness. Right ventricular systolic pressure could not be assessed.  3. Right atrial size was mildly dilated.  4. Small pericardial effusion.  5. The aortic valve is tricuspid Aortic valve regurgitation was not assessed by color flow Doppler.  SUMMARY   Normal LV systolic and diastolic function. No significant valve disease. Unable to estimate right sided pressures due to lack of TR jet, but D-shaped septum suggests elevated right ventricular pressures. IVC collapses, consistent with normal CVP.  FINDINGS  Left Ventricle: The left ventricle has normal systolic function,  with an ejection fraction of 55-60%. The cavity size was normal. There is no increase in left ventricular wall thickness. Left ventricular diastolic parameters were normal. There is the  interventricular septum is flattened in systole, consistent with right ventricular pressure overload. No evidence of left ventricular regional wall motion abnormalities.. Right Ventricle: The right ventricle has normal systolic function. The cavity was moderately enlarged. There is no increase in right ventricular wall thickness. Right ventricular systolic pressure could not be assessed. Left Atrium: left atrial size was normal in size Right Atrium: right atrial size was mildly dilated. Right atrial pressure is estimated at 3 mmHg. Interatrial Septum: No atrial level shunt detected by color flow Doppler. Pericardium: A small pericardial effusion is present. Mitral Valve: The mitral valve is normal in structure. Mitral valve regurgitation is trivial by color  flow Doppler. Tricuspid Valve: The tricuspid valve is normal in structure. Tricuspid valve regurgitation is trivial by color flow Doppler. Aortic Valve: The aortic valve is tricuspid Aortic valve regurgitation was not assessed by color flow Doppler. Pulmonic Valve: The pulmonic valve was grossly normal. Pulmonic valve regurgitation is mild by color flow Doppler. Venous: The inferior vena cava is normal in size with greater than 50% respiratory variability.  ASSESSMENT AND PLAN:  1.  PAH : due to OSA/Obesity hypoventilation f/u Dr Delton Coombes   2.  Normal coronary arteries on cath.   3.  Renal insuff : stable Cr 1.15 labs 09/28/18 ok to continue lasix 20 mg daily   4.  OSA on cpap followed by pulmonary and Dr Mayford Knife   5.  Morbid obesity discussed DASH diet and increase exercise.    Current medicines are reviewed with the patient today.  The patient Has no concerns regarding medicines.  Does not need general cardiology f/u F/U with pulmonary Dr Delton Coombes and Dr  Mayford Knife for OSA/CPAP  Signed, Charlton Haws, MD  03/27/2019 10:32 AM    [facility] [address] [place], [place], [place]  [address]/ [address] [place], [place] Phone: [phone]; Fax: [phone]  [phone]

## 2019-03-27 ENCOUNTER — Other Ambulatory Visit: Payer: Self-pay

## 2019-03-27 ENCOUNTER — Encounter: Payer: Self-pay | Admitting: Cardiovascular Disease

## 2019-03-27 ENCOUNTER — Ambulatory Visit: Payer: 59 | Admitting: Cardiovascular Disease

## 2019-03-27 VITALS — BP 132/84 | HR 82 | Ht 71.0 in | Wt 297.8 lb

## 2019-03-27 DIAGNOSIS — R0609 Other forms of dyspnea: Secondary | ICD-10-CM

## 2019-03-27 DIAGNOSIS — R0600 Dyspnea, unspecified: Secondary | ICD-10-CM | POA: Diagnosis not present

## 2019-03-27 NOTE — Patient Instructions (Addendum)
Medication Instructions:   *If you need a refill on your cardiac medications before your next appointment, please call your pharmacy*  Lab Work:  If you have labs (blood work) drawn today and your tests are completely normal, you will receive your results only by: . MyChart Message (if you have MyChart) OR . A paper copy in the mail If you have any lab test that is abnormal or we need to change your treatment, we will call you to review the results.  Follow-Up: At [facility], you and your health needs are our priority.  As part of our continuing mission to provide you with exceptional heart care, we have created designated Provider Care Teams.  These Care Teams include your primary Cardiologist (physician) and Advanced Practice Providers (APPs -  Physician Assistants and Nurse Practitioners) who all work together to provide you with the care you need, when you need it.  Your next appointment:   As needed  The format for your next appointment:   In Person  Provider:   You may see Dr. Nishan or one of the following Advanced Practice Providers on your designated Care Team:    Lori Gerhardt, NP  Laura Ingold, NP  Jill McDaniel, NP  

## 2019-06-13 IMAGING — NM NM PULMONARY PERFUSION PARTICULATE
8 series · 8 of 8 positions shown · non-contrast
Comparison: None.

Correlation: Chest radiograph 07/10/2018

CLINICAL DATA: Primary pulmonary hypertension

EXAM:
NUCLEAR MEDICINE PERFUSION LUNG SCAN
TECHNIQUE: Perfusion images were obtained in multiple projections after
intravenous injection of radiopharmaceutical.
Ventilation scans intentionally deferred if perfusion scan and chest
x-ray adequate for interpretation during COVID 19 epidemic.
RADIOPHARMACEUTICALS:  1.6 mCi Qc-33m MAA IV

[Series 1: ant/post perf · 4.14mm/px · 1 of 1 slices shown (1 of 2)]
[im 1/1]
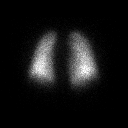

[Series 1: ant/post perf · 4.14mm/px · 1 of 1 slices shown (2 of 2)]
[im 1/1]
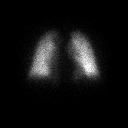

[Series 2: lao/rpo perf · 4.14mm/px · 1 of 1 slices shown (1 of 2)]
[im 1/1]
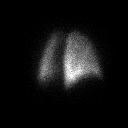

[Series 2: lao/rpo perf · 4.14mm/px · 1 of 1 slices shown (2 of 2)]
[im 1/1]
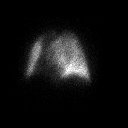

[Series 3: lt lat/rt lat perf · 4.14mm/px · 1 of 1 slices shown (1 of 2)]
[im 1/1]
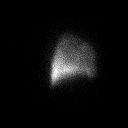

[Series 3: lt lat/rt lat perf · 4.14mm/px · 1 of 1 slices shown (2 of 2)]
[im 1/1]
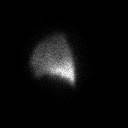

[Series 4: lpo/rao perf · 4.14mm/px · 1 of 1 slices shown (1 of 2)]
[im 1/1]
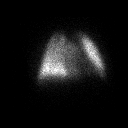

[Series 4: lpo/rao perf · 4.14mm/px · 1 of 1 slices shown (2 of 2)]
[im 1/1]
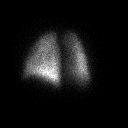

[8 of 8 positions shown; findings below may reference images not displayed]

FINDINGS: Normal perfusion to both lungs.

Normal perfusion gradient anteroposterior and craniocaudal without
gradient reversal.

No perfusion defects identified.
IMPRESSION: Normal perfusion lung scan.

## 2020-02-28 ENCOUNTER — Ambulatory Visit (INDEPENDENT_AMBULATORY_CARE_PROVIDER_SITE_OTHER): Payer: Medicare PPO | Admitting: Emergency Medicine

## 2020-02-28 ENCOUNTER — Encounter: Payer: Self-pay | Admitting: Emergency Medicine

## 2020-02-28 ENCOUNTER — Other Ambulatory Visit: Payer: Self-pay

## 2020-02-28 DIAGNOSIS — I2729 Other secondary pulmonary hypertension: Secondary | ICD-10-CM

## 2020-02-28 NOTE — Patient Instructions (Signed)
We will perform a walking oximetry on pulsed flow oxygen today to see if we can qualify you and try to order a portable oxygen concentrator. Work hard on trying to get back on your CPAP every night.  This is an important part of trying to decrease your pulmonary pressures. We will plan to repeat your echocardiogram in March 2022. Follow Dr. Delton Coombes in March after your echo so that we can review the results together.

## 2020-02-28 NOTE — Assessment & Plan Note (Signed)
Principally due to OSA and chronic hypoxemic respiratory failure.  Unclear whether RF and ANA are important.  We may decide to repeat these going forward to see if the levels change.  He needs to get back on his CPAP, discussed this with him today.  I'd Like to repeat his echocardiogram in a few months, hopefully with him back on CPAP  We will perform a walking oximetry on pulsed flow oxygen today to see if we can qualify you and try to order a portable oxygen concentrator. Work hard on trying to get back on your CPAP every night.  This is an important part of trying to decrease your pulmonary pressures. We will plan to repeat your echocardiogram in March 2022. Follow Dr. Delton Coombes in March after your echo so that we can review the results together.

## 2020-02-28 NOTE — Addendum Note (Signed)
Addended by: Dorisann Frames R on: 02/28/2020 05:22 PM   Modules accepted: Orders

## 2020-02-28 NOTE — Progress Notes (Signed)
Subjective:    Patient ID: Levi Nelson, male    DOB: [birth year], 65 y.o.   MRN: [id number]  HPI 65 year old obese man, former smoker (6-7 pack years) with diabetes, knee replacement,  possible COPD who was found to be hypoxemic on hospital admission to [facility] 11/2017.  It sounds like he probably had evidence for cor pulmonale and volume overload, was diuresed.  He was discharged on 2 L/min. He was tried empirically on Anoro after the hospitalization - no real change. He is on a stable dose lasix since the hospitalization. Never on diet meds.   He can get dyspnea with exertion, chores. No real cough.   He has never had a sleep study, is scheduled for a split-night study in May 2020  Review of his cardiology notes indicates that he had CT scan of his chest done in October 2019, had an 8 mm left lower lobe nodule.  I do not have those films available currently.  TTE 04/20/2018 >> intact LV fxn, evidence for RV strain and PAH. PASP wasn't estimated. Valves OK.    ROV 10/31/2018 --65 year old gentleman whom I met following a hospitalization for what sounded like decompensated cor pulmonale with significant secondary pulmonary hypertension.  Work-up so far has included sleep study that identified obstructive sleep apnea. He has gotten CPAP since last time, uses a nasal mask. He is using 2L/min. Feels that his breathing is doing fairly well - will get dyspnea with yard work. Remains on lasix $Remove'20mg'ilVfDxU$  daily. Never benefited from Anoro.   V/Q scan 07/10/2018 reviewed by me showed no infiltrates on chest x-ray, low probability for acute or chronic clot. His labs show slightly abnormal ANA, dilution 1: 40, nuclear, speckled.  Also his RF is elevated at 33.  Unclear significance in the absence of joint disease.  He also underwent a right and left heart catheterization on 08/17/2018 that identified normal coronaries, PAP 68/25 (41 mmHg) with normal left-sided pressures, no evidence of RV strain on that  study.  ROV 02/28/20 --65 year old gentleman who follows up today for secondary pulmonary hypertension in the setting of obstructive sleep apnea, chronic hypoxemic respiratory failure.  He had slightly elevated RF and a borderline positive ANA both of unclear significance in absence of any other evidence for connective tissue disease. He is trying to wear CPAP, but is having a hard time with mask fit and compliance, irritates him w his beard. Hasn't been wearing it for the last month, is using O2. He is having stable exertional dyspnea, no CP, no wheeze. He has lost about 20 lbs since last time. He has some swelling, stable, on lasix $Remove'20mg'KyQsHHh$  qd.   MDM:  Reviewed cardiology notes 03/27/2019   Review of Systems  Constitutional: Negative for fever and unexpected weight change.  HENT: Negative for congestion, dental problem, ear pain, nosebleeds, postnasal drip, rhinorrhea, sinus pressure, sneezing, sore throat and trouble swallowing.   Eyes: Negative for redness and itching.  Respiratory: Positive for shortness of breath. Negative for cough, chest tightness and wheezing.   Cardiovascular: Negative for palpitations and leg swelling.  Gastrointestinal: Negative for nausea and vomiting.  Genitourinary: Negative for dysuria.  Musculoskeletal: Negative for joint swelling.  Skin: Negative for rash.  Neurological: Negative for headaches.  Hematological: Does not bruise/bleed easily.  Psychiatric/Behavioral: Negative for dysphoric mood. The patient is not nervous/anxious.     Past Medical History:  Diagnosis Date  . Body mass index (BMI) 40.0-44.9, adult   . Cardiomegaly   . Chronic hypoxemic  respiratory failure ([place])   . Chronic respiratory failure with hypoxia ([place])   . DM (diabetes mellitus) ([place])   . Hypoxemia      Family History  Problem Relation Age of Onset  . Arthritis Mother   . Arthritis Father   . Arthritis Sister   No hx PAH or pulm disease.   Social History   Socioeconomic  History  . Marital status: Married    Spouse name: Not on file  . Number of children: Not on file  . Years of education: Not on file  . Highest education level: Not on file  Occupational History  . Not on file  Tobacco Use  . Smoking status: Former Smoker    Packs/day: 0.25    Years: 25.00    Pack years: 6.25    Types: Cigarettes  . Smokeless tobacco: Never Used  Vaping Use  . Vaping Use: Never used  Substance and Sexual Activity  . Alcohol use: Never  . Drug use: Never  . Sexual activity: Not on file  Other Topics Concern  . Not on file  Social History Narrative  . 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   [place] native. Lives in [place].   No Known Allergies   Outpatient Medications Prior to Visit  Medication Sig Dispense Refill  . amoxicillin (AMOXIL) 500 MG capsule Take 500 mg by mouth. For dental purposes only    . Dulaglutide 0.75 MG/0.5ML SOPN Inject 0.75 mg into the skin every Wednesday.     . furosemide (LASIX) 20 MG tablet Take 20 mg by mouth daily.    . OXYGEN Inhale 2 L into the lungs continuous.      No facility-administered medications prior to visit.        Objective:   Physical Exam  Vitals:   02/28/20 1614  BP: 134/82  Pulse: 97  Temp: (!) 97.2 F (36.2 C)  SpO2: 90%  Weight: 286 lb 6.4 oz (129.9 kg)  Height: $Remove'5\' 11"'FAKPMkD$  (1.803 m)   Gen: Pleasant, obese gentleman, in no distress,  normal affect  ENT: No lesions,  mouth clear,  oropharynx clear, no postnasal drip  Neck: No JVD, no stridor  Lungs: No use of accessory muscles, no crackles or wheezing on normal respiration,  no wheeze on forced expiration  Cardiovascular: RRR, heart sounds normal, no murmur or gallops, trace lower extremity peripheral edema  Musculoskeletal: No deformities, no cyanosis or clubbing  Neuro: alert, awake, non focal  Skin: Warm, no lesions or rash       Assessment & Plan:  Other secondary pulmonary hypertension ([place]) Principally due to OSA and chronic hypoxemic respiratory failure.  Unclear whether RF and ANA are important.  We may decide to repeat these going forward to see if the levels change.  He needs to get back on his CPAP, discussed this with him today.  I'd Like to repeat his echocardiogram in a few months, hopefully with him back on CPAP  We will perform a walking oximetry on pulsed flow oxygen today to see if we can qualify you and try  to order a portable oxygen concentrator. Work hard on trying to get back on your CPAP every night.  This is an important part of trying to decrease your pulmonary pressures. We will plan to repeat your echocardiogram in March 2022. Follow Dr. Lamonte Sakai in March after your echo so that we can review the results together.   Baltazar Apo, MD, PhD 02/28/2020, 4:52 PM [facility] [phone] or if no answer [phone]

## 2020-05-14 ENCOUNTER — Ambulatory Visit (HOSPITAL_COMMUNITY)
Admission: RE | Admit: 2020-05-14 | Discharge: 2020-05-14 | Disposition: A | Discharge status: Home / Self Care | Payer: Medicare PPO | Source: Ambulatory Visit | Attending: Emergency Medicine | Admitting: Emergency Medicine

## 2020-05-14 ENCOUNTER — Other Ambulatory Visit: Payer: Self-pay

## 2020-05-14 DIAGNOSIS — I2729 Other secondary pulmonary hypertension: Secondary | ICD-10-CM

## 2020-05-14 LAB — ECHOCARDIOGRAM COMPLETE
AR max vel: 4.14 cm2
AV Area VTI: 4.51 cm2
AV Area mean vel: 3.77 cm2
AV Mean grad: 1.9 mmHg
AV Peak grad: 3.5 mmHg
Ao pk vel: 0.94 m/s
Area-P 1/2: 2.04 cm2
S' Lateral: 2.2 cm

## 2020-05-14 NOTE — Progress Notes (Signed)
*  PRELIMINARY RESULTS* Echocardiogram 2D Echocardiogram has been performed.  Stacey Drain 05/14/2020, 4:06 PM

## 2020-05-19 ENCOUNTER — Telehealth: Payer: Self-pay | Admitting: Emergency Medicine

## 2020-05-19 NOTE — Telephone Encounter (Signed)
error 

## 2020-05-26 ENCOUNTER — Telehealth: Payer: Self-pay | Admitting: Emergency Medicine

## 2020-05-26 NOTE — Telephone Encounter (Signed)
I called and spoke with patient regarding echo results. Dr. Inis Sizer is not in the office this week but will be back next week. I will send a message to him to read it when he comes back on 06/01/2020. Patient verbalized understanding. Routing to Dr. Delton Coombes  Dr. Delton Coombes, please advise on echo results

## 2020-06-01 NOTE — Telephone Encounter (Signed)
His echocardiogram shows persistent severe pulmonary hypertension with strain on his Right heart function. This is likely multifactorial as we have discussed before. He needs an OV w me to review further.

## 2020-06-01 NOTE — Telephone Encounter (Signed)
I have called the pt and LM on VM to call back for his results.

## 2020-06-02 NOTE — Telephone Encounter (Signed)
Pt returning a phone call. Pt can be reached at [phone].

## 2020-06-02 NOTE — Telephone Encounter (Signed)
Called spoke with patient.  Let him know Dr. Kavin Leech recommendations. Scheduled him for 06/03/20 at 1015 in the morning  Nothing further needed at this time.

## 2020-06-03 ENCOUNTER — Other Ambulatory Visit: Payer: Self-pay

## 2020-06-03 ENCOUNTER — Encounter: Payer: Self-pay | Admitting: Emergency Medicine

## 2020-06-03 ENCOUNTER — Ambulatory Visit: Payer: Medicare PPO | Admitting: Emergency Medicine

## 2020-06-03 DIAGNOSIS — I2729 Other secondary pulmonary hypertension: Secondary | ICD-10-CM | POA: Diagnosis not present

## 2020-06-03 MED ORDER — SILDENAFIL CITRATE 20 MG PO TABS: 20.0000 mg | ORAL_TABLET | Freq: Three times a day (TID) | ORAL | 0 refills | Status: DC

## 2020-06-03 MED ORDER — SILDENAFIL CITRATE 20 MG PO TABS: 20.0000 mg | ORAL_TABLET | Freq: Three times a day (TID) | ORAL | Status: DC

## 2020-06-03 NOTE — Addendum Note (Signed)
Addended by: Dorisann Frames R on: 06/03/2020 11:32 AM   Modules accepted: Orders

## 2020-06-03 NOTE — Addendum Note (Signed)
Addended by: Dorisann Frames R on: 06/03/2020 10:51 AM   Modules accepted: Orders

## 2020-06-03 NOTE — Assessment & Plan Note (Addendum)
Addendum:  His pulmonary hypertension is multifactorial.  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  His rheumatoid factor remains elevated on 06/03/2020 which supports this.  We may need to uptitrate his diuretics going forward.  For now I think we need to push CPAP compliance, oxygen compliance.  Given the possible presence of WHO group 1 disease I think it is reasonable to add sildenafil to see if he gets benefit.  Likely recheck his echocardiogram in several months if the sildenafil is tolerated.

## 2020-06-03 NOTE — Addendum Note (Signed)
Addended by: Demetrio Lapping E on: 06/03/2020 10:55 AM   Modules accepted: Orders

## 2020-06-03 NOTE — Patient Instructions (Addendum)
Lab work today Please continue your Lasix 20 mg once daily.  We may decide to uptitrate this in the future We will start sildenafil 20 mg 3 times a day. Continue to wear your oxygen at 2 L/min.  You would probably benefit from up titrating your flow rate to 4 L/min when you are exerting yourself. Try to get back on your CPAP as often as possible. Follow with Dr. Delton Coombes in 2 months or sooner if you have any problems.

## 2020-06-03 NOTE — Progress Notes (Addendum)
Subjective:    Patient ID: Levi Nelson, male    DOB: [birth year], 65 y.o.   MRN: [id number]  HPI  ROV 06/03/20 --Mr. Veltre is 2 with a minimal tobacco history.  He has secondary pulmonary hypertension in the setting of documented obstructive sleep apnea, chronic hypoxemic respiratory failure.  We documented exertional hypoxemia at his last visit, required 4 to 6 L pulsed with exertion.  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 He reports that he is trying to use his CPAP, but unable every night due to preference sleeping on his side, lots of leakage. Remains on lasix 20mg  daily.  He is using his O2 at 2L/min, usually does not turn it up.   Repeat echocardiogram done 05/14/2020 showed persistent severe PAH with evidence for right heart enlargement, strain and dysfunction   Review of Systems  Constitutional: Negative for fever and unexpected weight change.  HENT: Negative for congestion, dental problem, ear pain, nosebleeds, postnasal drip, rhinorrhea, sinus pressure, sneezing, sore throat and trouble swallowing.   Eyes: Negative for redness and itching.  Respiratory: Positive for shortness of breath. Negative for cough, chest tightness and wheezing.   Cardiovascular: Negative for palpitations and leg swelling.  Gastrointestinal: Negative for nausea and vomiting.  Genitourinary: Negative for dysuria.  Musculoskeletal: Negative for joint swelling.  Skin: Negative for rash.  Neurological: Negative for headaches.  Hematological: Does not bruise/bleed easily.  Psychiatric/Behavioral: Negative for dysphoric mood. The patient is not nervous/anxious.     Past Medical History:  Diagnosis Date  . Body mass index (BMI) 40.0-44.9, adult   . Cardiomegaly   . Chronic hypoxemic respiratory failure (HCC)   .  Chronic respiratory failure with hypoxia (HCC)   . DM (diabetes mellitus) (HCC)   . Hypoxemia      Family History  Problem Relation Age of Onset  . Arthritis Mother   . Arthritis Father   . Arthritis Sister   No hx PAH or pulm disease.   Social History   Socioeconomic History  . Marital status: Married    Spouse name: Not on file  . Number of children: Not on file  . Years of education: Not on file  . Highest education level: Not on file  Occupational History  . Not on file  Tobacco Use  . Smoking status: Former Smoker    Packs/day: 0.25    Years: 25.00    Pack years: 6.25    Types: Cigarettes    Quit date: 2012    Years since quitting: 10.3  . Smokeless tobacco: Never Used  Vaping Use  . Vaping Use: Never used  Substance and Sexual Activity  . Alcohol use: Never  . Drug use: Never  . Sexual activity: Not on file  Other Topics Concern  . Not on file  Social History Narrative  . Not on file   Social Determinants of Health   Financial Resource Strain: Not on file  Food Insecurity: Not on file  Transportation Needs: Not on file  Physical Activity: Not on file  Stress: Not on file  Social Connections: Not on file  Intimate Partner Violence: Not on file  Was in [employer], [employer] He probably has an asbestos exposure through work in the 80's Has also worked at [employer], as a [employer].   [place] native. Lives in [place].  No Known Allergies   Outpatient Medications Prior to Visit  Medication Sig Dispense Refill  . amoxicillin (AMOXIL) 500 MG capsule Take 500 mg by mouth. For dental purposes only    . furosemide (LASIX) 20 MG tablet Take 20 mg by mouth daily.    . OXYGEN Inhale 2 L into the lungs continuous.     . rosuvastatin (CRESTOR) 20 MG tablet rosuvastatin 20 mg tablet  TAKE 1 TABLET BY MOUTH EVERY DAY    . Dulaglutide 0.75 MG/0.5ML SOPN Inject 0.75 mg into the skin every Wednesday.  (Patient not taking: Reported on 06/03/2020)     No  facility-administered medications prior to visit.        Objective:   Physical Exam  Vitals:   06/03/20 1015  BP: 128/76  Pulse: 86  Temp: 98.1 F (36.7 C)  TempSrc: Temporal  SpO2: 92%  Weight: 288 lb 12.8 oz (131 kg)  Height: 5\' 11"  (1.803 m)   Gen: Pleasant, obese gentleman, in no distress,  normal affect  ENT: No lesions,  mouth clear,  oropharynx clear, no postnasal drip  Neck: No JVD, no stridor  Lungs: No use of accessory muscles, no crackles or wheezing on normal respiration, no wheeze on forced expiration  Cardiovascular: RRR, heart sounds normal, no murmur or gallops, trace lower extremity peripheral edema  Musculoskeletal: No deformities, no cyanosis or clubbing  Neuro: alert, awake, non focal  Skin: Warm, no lesions or rash       Assessment & Plan:  Other secondary pulmonary hypertension (HCC) Addendum:  His pulmonary hypertension is multifactorial.  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  His rheumatoid factor remains elevated on 06/03/2020 which supports this.  We may need to uptitrate his diuretics going forward.  For now I think we need to push CPAP compliance, oxygen compliance.  Given the possible presence of WHO group 1 disease I think it is reasonable to add sildenafil to see if he gets benefit.  Likely recheck his echocardiogram in several months if the sildenafil is tolerated.   06/05/2020, MD, PhD 06/24/2020, 4:58 PM [facility] [phone] or if no answer [phone]

## 2020-06-08 LAB — RHEUMATOID FACTOR: Rheumatoid fact SerPl-aCnc: 28 IU/mL — ABNORMAL HIGH (ref <14)

## 2020-06-08 LAB — ANA: Anti Nuclear Antibody (ANA): NEGATIVE

## 2020-06-10 ENCOUNTER — Telehealth: Payer: Self-pay | Admitting: Emergency Medicine

## 2020-06-11 MED ORDER — SILDENAFIL CITRATE 20 MG PO TABS: 20.0000 mg | ORAL_TABLET | Freq: Three times a day (TID) | ORAL | 1 refills | Status: AC

## 2020-06-11 NOTE — Telephone Encounter (Signed)
Called and spoke with patient. He stated that he needs a PA on the sildenafil RB called in for him. I reviewed the prescription and it looks like only 10 tablets were sent in, with him taking 1 tablet 3 times a day. I reviewed the OV note. RB wants him to be on this for the treatment of his PAH. I advised him that I would send the correct RX and do the PA. He verbalized understanding.   Attempted PA on [email or web address] but was advised the PA had already been denied. I called [employer] and was advised that the PA was denied due to the diagnosis code that was used. I started an urgent appeal. Ref# [id number]. Was advised that we we will receive a fax once a determination has been made.   Will leave this encounter open for follow up.

## 2020-06-11 NOTE — Telephone Encounter (Signed)
Pt is upset that he hasn't gotten a call back yet & can be reached at [phone].

## 2020-06-15 NOTE — Telephone Encounter (Signed)
Marchelle Folks, please advise if you have seen this determination. If not triage can call for update. Thanks.

## 2020-06-15 NOTE — Telephone Encounter (Signed)
No faxes received at this point.

## 2020-06-16 NOTE — Telephone Encounter (Signed)
Papers received that the Sildenafil 20 mg has been denied.  I have placed the papers in RB look at.

## 2020-06-22 NOTE — Telephone Encounter (Signed)
Please advise if we have any update on this what is recommended for pt.

## 2020-06-24 NOTE — Telephone Encounter (Signed)
Dr. Delton Coombes currently reviewing medications. Will update when advisement is received.

## 2020-06-24 NOTE — Telephone Encounter (Signed)
This was rejected as a possible therapy for WHO Group 3 PAH. I believe there may also be a component of WHO Group 1 disease here >> he has an elevated RF, a positive ANA in the past. He might still be a candidate. We will determine next steps in the Appeal process.

## 2020-06-24 NOTE — Telephone Encounter (Signed)
Levi Nelson was able to reach [employer]. 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

## 2020-06-25 NOTE — Telephone Encounter (Signed)
Called and spoke with [facility] who stated Dr. Delton Coombes may apply for a rapid appeal by amending last OV note to state pt is WHO group 1. Dr. Delton Coombes amended OV note to include needed information. OV note was faxed to [employer]'s rapid appeal fax number with successful fax confirmation recivied. RN will call tomorrow to get an update on appeal.

## 2020-07-01 NOTE — Telephone Encounter (Signed)
Unable to locate the [facility] number to call for an update. Called the [facility] pharmacy and was placed on a hold for more than 12 min.   Marchelle Folks, do you have the number you called for [facility]?

## 2020-07-02 NOTE — Telephone Encounter (Signed)
Spoke with [facility] and verified medication could be filled. Amber with [employer] verified Rx has been approved and pt will be able to pick up medication after 6pm today. Left detailed message on pt's cell phone explaining above information (per DPR). Pt advised to call office if he has any questions. Nothing further needed at this time.

## 2020-08-04 ENCOUNTER — Ambulatory Visit: Payer: Medicare PPO | Admitting: Emergency Medicine

## 2020-09-09 ENCOUNTER — Ambulatory Visit: Payer: Medicare PPO | Admitting: Emergency Medicine

## 2020-10-13 ENCOUNTER — Ambulatory Visit: Payer: Medicare PPO | Admitting: Emergency Medicine

## 2022-02-07 DEATH — deceased
